# Patient Record
Sex: Male | Born: 1955
Health system: Southern US, Community
[De-identification: ages and names within clinical notes are randomized; demographics above are authoritative.]

## PROBLEM LIST (undated history)

## (undated) DIAGNOSIS — G4730 Sleep apnea, unspecified: Secondary | ICD-10-CM

## (undated) DIAGNOSIS — I2699 Other pulmonary embolism without acute cor pulmonale: Secondary | ICD-10-CM

## (undated) DIAGNOSIS — D689 Coagulation defect, unspecified: Secondary | ICD-10-CM

## (undated) DIAGNOSIS — I10 Essential (primary) hypertension: Secondary | ICD-10-CM

## (undated) DIAGNOSIS — E7800 Pure hypercholesterolemia, unspecified: Secondary | ICD-10-CM

## (undated) DIAGNOSIS — C189 Malignant neoplasm of colon, unspecified: Secondary | ICD-10-CM

## (undated) HISTORY — DX: Essential (primary) hypertension: I10

## (undated) HISTORY — DX: Sleep apnea, unspecified: G47.30

## (undated) HISTORY — DX: Malignant neoplasm of colon, unspecified: C18.9

## (undated) HISTORY — PX: OTHER SURGICAL HISTORY: SHX169

## (undated) HISTORY — DX: Coagulation defect, unspecified: D68.9

## (undated) HISTORY — DX: Other pulmonary embolism without acute cor pulmonale: I26.99

## (undated) HISTORY — DX: Pure hypercholesterolemia, unspecified: E78.00

---
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
## 2005-02-26 ENCOUNTER — Ambulatory Visit (HOSPITAL_COMMUNITY): Admission: RE | Admit: 2005-02-26 | Discharge: 2005-02-26 | Payer: Self-pay | Admitting: Gastroenterology

## 2005-02-26 ENCOUNTER — Encounter (INDEPENDENT_AMBULATORY_CARE_PROVIDER_SITE_OTHER): Payer: Self-pay | Admitting: Specialist

## 2005-02-27 ENCOUNTER — Ambulatory Visit (HOSPITAL_COMMUNITY): Admission: RE | Admit: 2005-02-27 | Discharge: 2005-02-27 | Payer: Self-pay | Admitting: Gastroenterology

## 2005-03-02 ENCOUNTER — Ambulatory Visit: Payer: Self-pay | Admitting: Hematology and Oncology

## 2005-03-12 ENCOUNTER — Ambulatory Visit (HOSPITAL_COMMUNITY): Admission: RE | Admit: 2005-03-12 | Discharge: 2005-03-12 | Payer: Self-pay | Admitting: Hematology and Oncology

## 2005-03-17 ENCOUNTER — Ambulatory Visit (HOSPITAL_COMMUNITY): Admission: RE | Admit: 2005-03-17 | Discharge: 2005-03-17 | Payer: Self-pay | Admitting: Hematology and Oncology

## 2005-03-19 ENCOUNTER — Ambulatory Visit: Admission: RE | Admit: 2005-03-19 | Discharge: 2005-05-18 | Payer: Self-pay | Admitting: Radiation Oncology

## 2005-04-21 ENCOUNTER — Ambulatory Visit: Payer: Self-pay | Admitting: Hematology and Oncology

## 2005-07-07 ENCOUNTER — Ambulatory Visit: Payer: Self-pay | Admitting: Hematology and Oncology

## 2005-08-26 ENCOUNTER — Ambulatory Visit: Payer: Self-pay | Admitting: Hematology & Oncology

## 2005-09-05 ENCOUNTER — Inpatient Hospital Stay (HOSPITAL_COMMUNITY): Admission: EM | Admit: 2005-09-05 | Discharge: 2005-09-09 | Payer: Self-pay | Admitting: Emergency Medicine

## 2005-09-06 ENCOUNTER — Ambulatory Visit: Payer: Self-pay | Admitting: Oncology

## 2005-10-12 HISTORY — PX: COLON SURGERY: SHX602

## 2005-10-13 ENCOUNTER — Ambulatory Visit: Payer: Self-pay | Admitting: Hematology & Oncology

## 2005-12-03 ENCOUNTER — Ambulatory Visit: Payer: Self-pay | Admitting: Hematology & Oncology

## 2006-01-13 ENCOUNTER — Ambulatory Visit (HOSPITAL_COMMUNITY): Admission: RE | Admit: 2006-01-13 | Discharge: 2006-01-13 | Payer: Self-pay | Admitting: Hematology & Oncology

## 2006-01-15 LAB — PROTIME-INR: INR: 3 (ref 2.00–3.50)

## 2006-01-18 ENCOUNTER — Ambulatory Visit: Payer: Self-pay | Admitting: Hematology & Oncology

## 2006-01-21 LAB — PROTIME-INR: Protime: 20.9 Seconds — ABNORMAL HIGH (ref 10.6–13.4)

## 2006-02-03 LAB — PROTIME-INR

## 2006-02-05 LAB — PROTIME-INR

## 2006-02-12 LAB — PROTIME-INR: Protime: 20.3 Seconds — ABNORMAL HIGH (ref 10.6–13.4)

## 2006-02-19 LAB — PROTIME-INR: Protime: 16.8 Seconds — ABNORMAL HIGH (ref 10.6–13.4)

## 2006-02-26 LAB — PROTIME-INR
INR: 1.6 — ABNORMAL LOW (ref 2.00–3.50)
Protime: 15.7 Seconds — ABNORMAL HIGH (ref 10.6–13.4)

## 2006-03-05 ENCOUNTER — Ambulatory Visit: Payer: Self-pay | Admitting: Hematology & Oncology

## 2006-03-05 LAB — PROTIME-INR: INR: 1.5 — ABNORMAL LOW (ref 2.00–3.50)

## 2006-03-12 LAB — PROTIME-INR
INR: 1.5 — ABNORMAL LOW (ref 2.00–3.50)
Protime: 15.3 Seconds — ABNORMAL HIGH (ref 10.6–13.4)

## 2006-03-19 LAB — PROTIME-INR: INR: 1.7 — ABNORMAL LOW (ref 2.00–3.50)

## 2006-03-26 LAB — PROTIME-INR: INR: 1.7 — ABNORMAL LOW (ref 2.00–3.50)

## 2006-04-07 LAB — CBC WITH DIFFERENTIAL/PLATELET
BASO%: 0.4 % (ref 0.0–2.0)
EOS%: 0.6 % (ref 0.0–7.0)
HCT: 39.3 % (ref 38.7–49.9)
HGB: 13.8 g/dL (ref 13.0–17.1)
MCH: 29.7 pg (ref 28.0–33.4)
MCHC: 35.1 g/dL (ref 32.0–35.9)
MONO#: 0.4 10*3/uL (ref 0.1–0.9)
NEUT%: 53.2 % (ref 40.0–75.0)
RDW: 12.5 % (ref 11.2–14.6)
WBC: 4.4 10*3/uL (ref 4.0–10.0)
lymph#: 1.6 10*3/uL (ref 0.9–3.3)

## 2006-04-07 LAB — COMPREHENSIVE METABOLIC PANEL
AST: 23 U/L (ref 0–37)
Albumin: 5 g/dL (ref 3.5–5.2)
Alkaline Phosphatase: 81 U/L (ref 39–117)
Calcium: 9.5 mg/dL (ref 8.4–10.5)
Creatinine, Ser: 0.9 mg/dL (ref 0.40–1.50)
Sodium: 139 mEq/L (ref 135–145)
Total Bilirubin: 0.6 mg/dL (ref 0.3–1.2)

## 2006-04-07 LAB — PROTIME-INR: INR: 1.9 — ABNORMAL LOW (ref 2.00–3.50)

## 2006-04-21 ENCOUNTER — Ambulatory Visit (HOSPITAL_COMMUNITY): Admission: RE | Admit: 2006-04-21 | Discharge: 2006-04-21 | Payer: Self-pay | Admitting: Hematology & Oncology

## 2006-04-22 ENCOUNTER — Ambulatory Visit: Payer: Self-pay | Admitting: Hematology & Oncology

## 2006-05-28 LAB — PROTIME-INR: Protime: 14.4 Seconds — ABNORMAL HIGH (ref 10.6–13.4)

## 2006-06-08 ENCOUNTER — Ambulatory Visit: Payer: Self-pay | Admitting: Hematology & Oncology

## 2006-06-09 LAB — PROTIME-INR: INR: 1.2 — ABNORMAL LOW (ref 2.00–3.50)

## 2006-06-30 LAB — COMPREHENSIVE METABOLIC PANEL
ALT: 20 U/L (ref 0–40)
Alkaline Phosphatase: 78 U/L (ref 39–117)
CO2: 29 mEq/L (ref 19–32)
Potassium: 3.9 mEq/L (ref 3.5–5.3)
Sodium: 141 mEq/L (ref 135–145)
Total Bilirubin: 0.5 mg/dL (ref 0.3–1.2)
Total Protein: 7.2 g/dL (ref 6.0–8.3)

## 2006-06-30 LAB — CBC WITH DIFFERENTIAL/PLATELET
BASO%: 0.3 % (ref 0.0–2.0)
Eosinophils Absolute: 0 10*3/uL (ref 0.0–0.5)
HCT: 36 % — ABNORMAL LOW (ref 38.7–49.9)
MCHC: 35.5 g/dL (ref 32.0–35.9)
MONO#: 0.4 10*3/uL (ref 0.1–0.9)
NEUT#: 3.2 10*3/uL (ref 1.5–6.5)
NEUT%: 64.6 % (ref 40.0–75.0)
WBC: 4.9 10*3/uL (ref 4.0–10.0)
lymph#: 1.3 10*3/uL (ref 0.9–3.3)

## 2006-06-30 LAB — PROTIME-INR: Protime: 19.2 Seconds — ABNORMAL HIGH (ref 10.6–13.4)

## 2006-07-21 ENCOUNTER — Ambulatory Visit: Payer: Self-pay | Admitting: Hematology & Oncology

## 2006-07-22 LAB — PROTIME-INR
INR: 2.1 (ref 2.00–3.50)
Protime: 25.2 Seconds — ABNORMAL HIGH (ref 10.6–13.4)

## 2006-08-04 ENCOUNTER — Inpatient Hospital Stay (HOSPITAL_COMMUNITY): Admission: EM | Admit: 2006-08-04 | Discharge: 2006-08-30 | Payer: Self-pay | Admitting: Emergency Medicine

## 2006-08-04 ENCOUNTER — Ambulatory Visit: Payer: Self-pay | Admitting: Pulmonary Disease

## 2006-08-13 ENCOUNTER — Ambulatory Visit: Payer: Self-pay | Admitting: Hematology & Oncology

## 2006-08-13 ENCOUNTER — Encounter (INDEPENDENT_AMBULATORY_CARE_PROVIDER_SITE_OTHER): Payer: Self-pay | Admitting: *Deleted

## 2006-08-16 ENCOUNTER — Encounter: Payer: Self-pay | Admitting: Internal Medicine

## 2006-08-16 ENCOUNTER — Ambulatory Visit: Payer: Self-pay | Admitting: Internal Medicine

## 2006-09-01 ENCOUNTER — Ambulatory Visit: Payer: Self-pay | Admitting: Hematology & Oncology

## 2006-09-01 LAB — COMPREHENSIVE METABOLIC PANEL
ALT: 24 U/L (ref 0–53)
Alkaline Phosphatase: 177 U/L — ABNORMAL HIGH (ref 39–117)
CO2: 28 mEq/L (ref 19–32)
Creatinine, Ser: 0.82 mg/dL (ref 0.40–1.50)
Total Bilirubin: 0.4 mg/dL (ref 0.3–1.2)

## 2006-09-01 LAB — PROTIME-INR: INR: 4.4 — ABNORMAL HIGH (ref 2.00–3.50)

## 2006-09-01 LAB — CEA: CEA: <0.5 ng/mL (ref 0.0–5.0)

## 2006-09-01 LAB — CBC WITH DIFFERENTIAL/PLATELET
Basophils Absolute: 0 10*3/uL (ref 0.0–0.1)
Eosinophils Absolute: 0.4 10*3/uL (ref 0.0–0.5)
HCT: 31.8 % — ABNORMAL LOW (ref 38.7–49.9)
HGB: 11 g/dL — ABNORMAL LOW (ref 13.0–17.1)
MCH: 30.1 pg (ref 28.0–33.4)
MONO#: 0.5 10*3/uL (ref 0.1–0.9)
NEUT#: 3.9 10*3/uL (ref 1.5–6.5)
NEUT%: 66.1 % (ref 40.0–75.0)
RDW: 13.7 % (ref 11.2–14.6)
WBC: 5.9 10*3/uL (ref 4.0–10.0)
lymph#: 1.1 10*3/uL (ref 0.9–3.3)

## 2006-09-06 LAB — PROTIME-INR

## 2006-09-13 LAB — PROTIME-INR
INR: 3.8 — ABNORMAL HIGH (ref 2.00–3.50)
Protime: 45.6 Seconds — ABNORMAL HIGH (ref 10.6–13.4)

## 2006-09-20 LAB — PROTIME-INR
INR: 2.4 (ref 2.00–3.50)
Protime: 28.8 Seconds — ABNORMAL HIGH (ref 10.6–13.4)

## 2006-09-24 LAB — COMPREHENSIVE METABOLIC PANEL
ALT: 13 U/L (ref 0–53)
AST: 14 U/L (ref 0–37)
Alkaline Phosphatase: 84 U/L (ref 39–117)
BUN: 13 mg/dL (ref 6–23)
Calcium: 9.6 mg/dL (ref 8.4–10.5)
Chloride: 102 mEq/L (ref 96–112)
Creatinine, Ser: 0.71 mg/dL (ref 0.40–1.50)

## 2006-09-24 LAB — CBC WITH DIFFERENTIAL/PLATELET
EOS%: 2.8 % (ref 0.0–7.0)
LYMPH%: 24.1 % (ref 14.0–48.0)
MCH: 29 pg (ref 28.0–33.4)
MCHC: 34.6 g/dL (ref 32.0–35.9)
MCV: 83.9 fL (ref 81.6–98.0)
MONO%: 6.6 % (ref 0.0–13.0)
Platelets: 236 10*3/uL (ref 145–400)
RBC: 4.36 10*6/uL (ref 4.20–5.71)
RDW: 11.7 % (ref 11.2–14.6)

## 2006-09-24 LAB — PROTIME-INR
INR: 2.6 (ref 2.00–3.50)
Protime: 31.2 Seconds — ABNORMAL HIGH (ref 10.6–13.4)

## 2006-10-08 LAB — PROTIME-INR: INR: 2.4 (ref 2.00–3.50)

## 2006-10-19 ENCOUNTER — Ambulatory Visit: Payer: Self-pay | Admitting: Hematology & Oncology

## 2006-10-22 LAB — PROTIME-INR: Protime: 30 Seconds — ABNORMAL HIGH (ref 10.6–13.4)

## 2006-11-05 LAB — CBC WITH DIFFERENTIAL/PLATELET
Basophils Absolute: 0 10*3/uL (ref 0.0–0.1)
Eosinophils Absolute: 0.1 10*3/uL (ref 0.0–0.5)
HCT: 41.5 % (ref 38.7–49.9)
LYMPH%: 29.8 % (ref 14.0–48.0)
MCV: 83.9 fL (ref 81.6–98.0)
MONO#: 0.3 10*3/uL (ref 0.1–0.9)
MONO%: 7.4 % (ref 0.0–13.0)
NEUT#: 2.8 10*3/uL (ref 1.5–6.5)
NEUT%: 60.7 % (ref 40.0–75.0)
Platelets: 270 10*3/uL (ref 145–400)
RBC: 4.95 10*6/uL (ref 4.20–5.71)
WBC: 4.6 10*3/uL (ref 4.0–10.0)

## 2006-11-05 LAB — COMPREHENSIVE METABOLIC PANEL
ALT: 12 U/L (ref 0–53)
AST: 16 U/L (ref 0–37)
BUN: 10 mg/dL (ref 6–23)
Calcium: 9.8 mg/dL (ref 8.4–10.5)
Chloride: 102 mEq/L (ref 96–112)
Creatinine, Ser: 0.92 mg/dL (ref 0.40–1.50)
Total Bilirubin: 0.4 mg/dL (ref 0.3–1.2)

## 2006-11-05 LAB — CEA: CEA: 0.5 ng/mL (ref 0.0–5.0)

## 2006-11-19 LAB — PROTIME-INR: INR: 2.5 (ref 2.00–3.50)

## 2006-12-01 ENCOUNTER — Ambulatory Visit: Payer: Self-pay | Admitting: Hematology & Oncology

## 2006-12-03 ENCOUNTER — Ambulatory Visit (HOSPITAL_COMMUNITY): Admission: RE | Admit: 2006-12-03 | Discharge: 2006-12-03 | Payer: Self-pay | Admitting: Hematology & Oncology

## 2006-12-03 LAB — PROTIME-INR: Protime: 33.6 Seconds — ABNORMAL HIGH (ref 10.6–13.4)

## 2007-01-12 ENCOUNTER — Ambulatory Visit: Payer: Self-pay | Admitting: Hematology & Oncology

## 2007-01-14 LAB — PROTIME-INR: Protime: 22.8 Seconds — ABNORMAL HIGH (ref 10.6–13.4)

## 2007-02-14 LAB — PROTIME-INR: Protime: 22.8 Seconds — ABNORMAL HIGH (ref 10.6–13.4)

## 2007-03-20 ENCOUNTER — Ambulatory Visit: Payer: Self-pay | Admitting: Hematology & Oncology

## 2007-03-21 ENCOUNTER — Ambulatory Visit (HOSPITAL_COMMUNITY): Admission: RE | Admit: 2007-03-21 | Discharge: 2007-03-21 | Payer: Self-pay | Admitting: Hematology & Oncology

## 2007-03-23 LAB — CBC WITH DIFFERENTIAL/PLATELET
Eosinophils Absolute: 0.1 10*3/uL (ref 0.0–0.5)
HCT: 38.5 % — ABNORMAL LOW (ref 38.7–49.9)
LYMPH%: 40.9 % (ref 14.0–48.0)
MCHC: 35.9 g/dL (ref 32.0–35.9)
MCV: 83.4 fL (ref 81.6–98.0)
MONO#: 0.3 10*3/uL (ref 0.1–0.9)
MONO%: 7.8 % (ref 0.0–13.0)
NEUT#: 2 10*3/uL (ref 1.5–6.5)
NEUT%: 49.4 % (ref 40.0–75.0)
Platelets: 241 10*3/uL (ref 145–400)
WBC: 4.1 10*3/uL (ref 4.0–10.0)

## 2007-03-23 LAB — COMPREHENSIVE METABOLIC PANEL
Albumin: 4.6 g/dL (ref 3.5–5.2)
BUN: 15 mg/dL (ref 6–23)
CO2: 24 mEq/L (ref 19–32)
Calcium: 9.3 mg/dL (ref 8.4–10.5)
Glucose, Bld: 87 mg/dL (ref 70–99)
Potassium: 4.3 mEq/L (ref 3.5–5.3)
Sodium: 139 mEq/L (ref 135–145)
Total Protein: 7.3 g/dL (ref 6.0–8.3)

## 2007-03-23 LAB — CEA: CEA: <0.5 ng/mL (ref 0.0–5.0)

## 2007-06-01 ENCOUNTER — Ambulatory Visit: Payer: Self-pay | Admitting: Hematology & Oncology

## 2007-06-03 LAB — PROTIME-INR: Protime: 15.6 Seconds — ABNORMAL HIGH (ref 10.6–13.4)

## 2007-07-04 ENCOUNTER — Ambulatory Visit (HOSPITAL_COMMUNITY): Admission: RE | Admit: 2007-07-04 | Discharge: 2007-07-04 | Payer: Self-pay | Admitting: Hematology & Oncology

## 2007-07-04 LAB — PROTIME-INR
INR: 1.9 — ABNORMAL LOW (ref 2.00–3.50)
Protime: 22.8 Seconds — ABNORMAL HIGH (ref 10.6–13.4)

## 2007-07-21 ENCOUNTER — Ambulatory Visit: Payer: Self-pay | Admitting: Hematology & Oncology

## 2007-07-25 LAB — CBC WITH DIFFERENTIAL/PLATELET
BASO%: 0.6 % (ref 0.0–2.0)
EOS%: 1.6 % (ref 0.0–7.0)
MCH: 30.6 pg (ref 28.0–33.4)
MCHC: 36.5 g/dL — ABNORMAL HIGH (ref 32.0–35.9)
MCV: 84 fL (ref 81.6–98.0)
MONO%: 8.3 % (ref 0.0–13.0)
NEUT#: 2.4 10*3/uL (ref 1.5–6.5)
RBC: 4.74 10*6/uL (ref 4.20–5.71)
RDW: 13.2 % (ref 11.2–14.6)

## 2007-07-25 LAB — CEA: CEA: <0.5 ng/mL (ref 0.0–5.0)

## 2007-07-25 LAB — COMPREHENSIVE METABOLIC PANEL
BUN: 9 mg/dL (ref 6–23)
CO2: 26 mEq/L (ref 19–32)
Creatinine, Ser: 0.83 mg/dL (ref 0.40–1.50)
Glucose, Bld: 97 mg/dL (ref 70–99)
Total Bilirubin: 0.8 mg/dL (ref 0.3–1.2)
Total Protein: 7.4 g/dL (ref 6.0–8.3)

## 2007-07-25 LAB — PROTIME-INR
INR: 2.4 (ref 2.00–3.50)
Protime: 28.8 Seconds — ABNORMAL HIGH (ref 10.6–13.4)

## 2007-09-05 ENCOUNTER — Ambulatory Visit: Payer: Self-pay | Admitting: Hematology & Oncology

## 2007-09-05 LAB — PROTIME-INR
INR: 2.1 (ref 2.00–3.50)
Protime: 25.2 Seconds — ABNORMAL HIGH (ref 10.6–13.4)

## 2007-10-12 ENCOUNTER — Ambulatory Visit: Payer: Self-pay | Admitting: Hematology & Oncology

## 2007-10-17 ENCOUNTER — Ambulatory Visit (HOSPITAL_COMMUNITY): Admission: RE | Admit: 2007-10-17 | Discharge: 2007-10-17 | Payer: Self-pay | Admitting: Hematology & Oncology

## 2007-10-17 LAB — PROTIME-INR: Protime: 26.4 Seconds — ABNORMAL HIGH (ref 10.6–13.4)

## 2007-11-30 ENCOUNTER — Ambulatory Visit: Payer: Self-pay | Admitting: Hematology & Oncology

## 2007-12-05 LAB — CBC WITH DIFFERENTIAL/PLATELET
BASO%: 1.1 % (ref 0.0–2.0)
Eosinophils Absolute: 0 10*3/uL (ref 0.0–0.5)
LYMPH%: 33.5 % (ref 14.0–48.0)
MCH: 30.5 pg (ref 28.0–33.4)
MCHC: 35.8 g/dL (ref 32.0–35.9)
MCV: 85 fL (ref 81.6–98.0)
MONO%: 5.9 % (ref 0.0–13.0)
Platelets: 243 10*3/uL (ref 145–400)
RBC: 4.65 10*6/uL (ref 4.20–5.71)

## 2007-12-05 LAB — COMPREHENSIVE METABOLIC PANEL
Alkaline Phosphatase: 65 U/L (ref 39–117)
Glucose, Bld: 94 mg/dL (ref 70–99)
Sodium: 138 mEq/L (ref 135–145)
Total Bilirubin: 0.5 mg/dL (ref 0.3–1.2)
Total Protein: 7.5 g/dL (ref 6.0–8.3)

## 2007-12-05 LAB — PROTHROMBIN TIME: INR: 1.8 — ABNORMAL HIGH (ref 0.0–1.5)

## 2008-01-09 ENCOUNTER — Ambulatory Visit: Payer: Self-pay | Admitting: Hematology & Oncology

## 2008-01-11 IMAGING — CT CT ABDOMEN W/ CM
2 of 5 series · 17 of 46 positions shown, 19 images · IV contrast (omnipaque)
Comparison: 04/21/2006

CHEST CT WITH CONTRAST

CLINICAL DATA: Abdominal pain, nausea. History of rectal cancer and colon
resection.
TECHNIQUE: Multidetector CT imaging of the chest, abdomen, and pelvis was
performed following the standard protocol during bolus administration of
intravenous contrast.

Contrast:  125 cc Omnipaque 300

[Series 2: cap 5.0 b40f st · axial · 0.68mm/px · z∈[+808,+1358]mm · 14 of 124 slices shown, 16 images]
[im 7/124  soft-tissue]
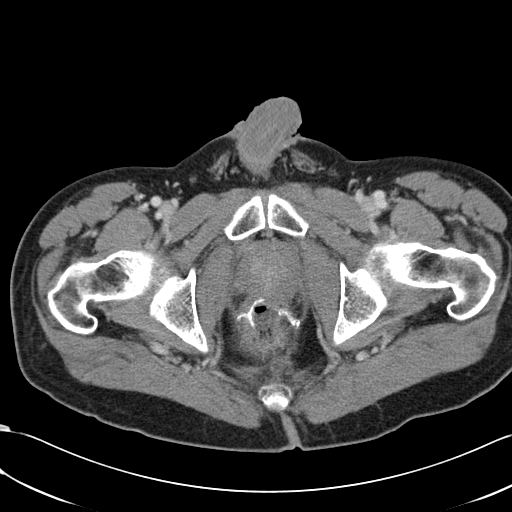
[im 7/124  bone]
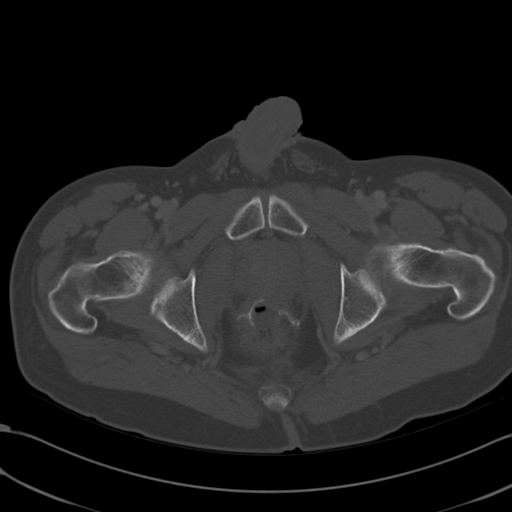
[im 14/124  soft-tissue]
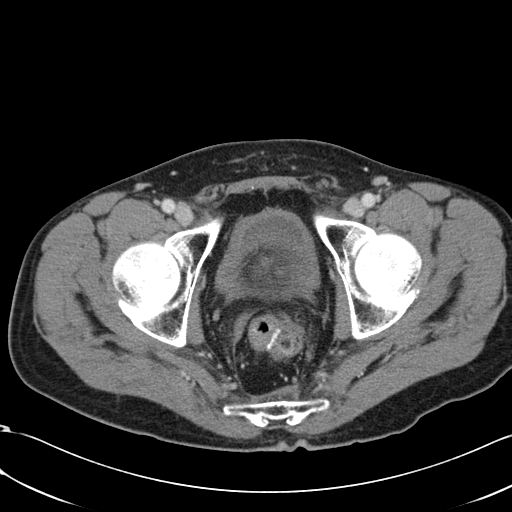
[im 28/124  soft-tissue]
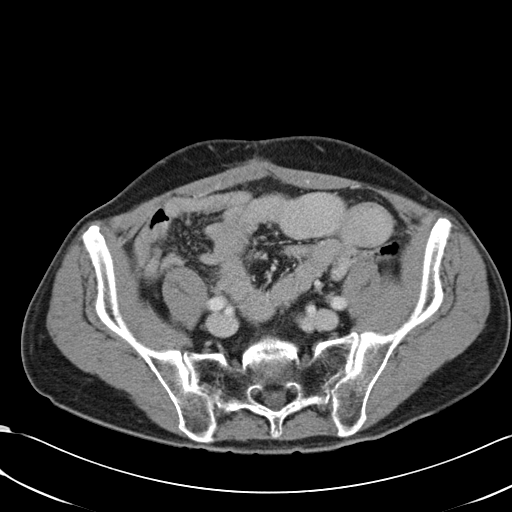
[im 35/124  soft-tissue]
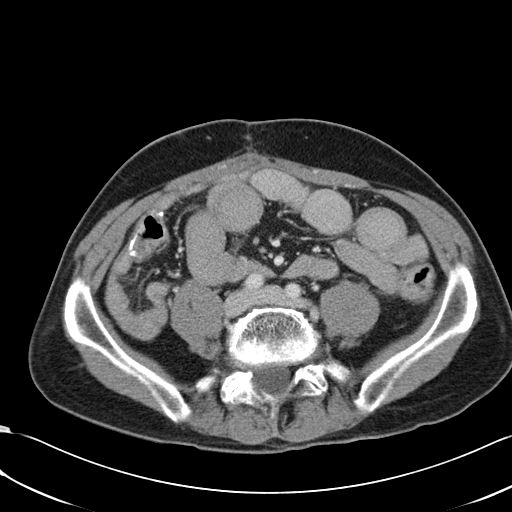
[im 42/124  soft-tissue]
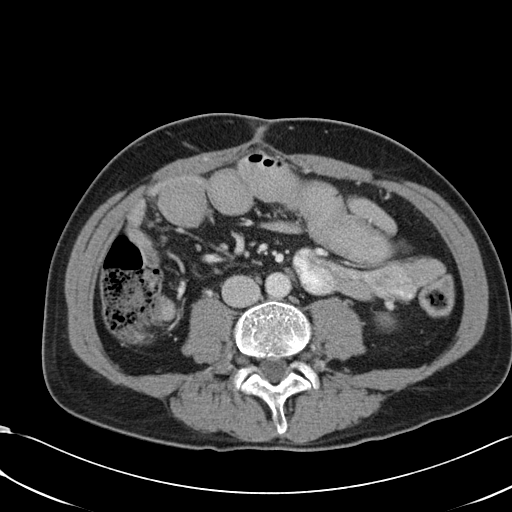
[im 48/124  soft-tissue]
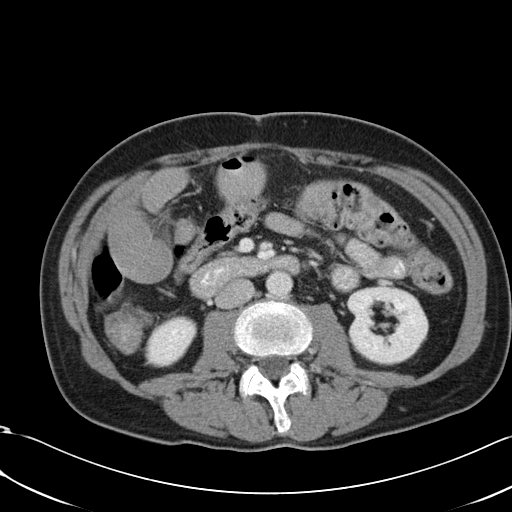
[im 55/124  soft-tissue]
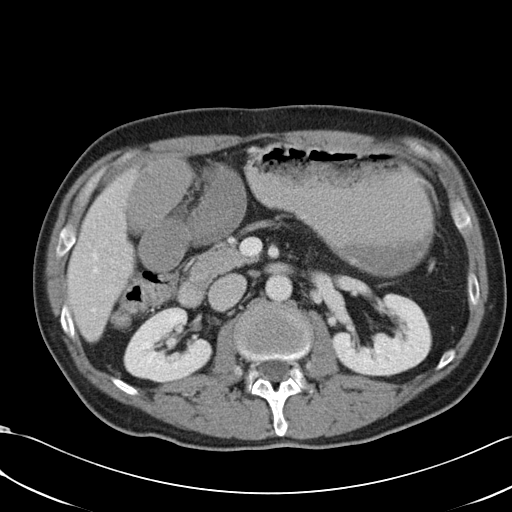
[im 69/124  soft-tissue]
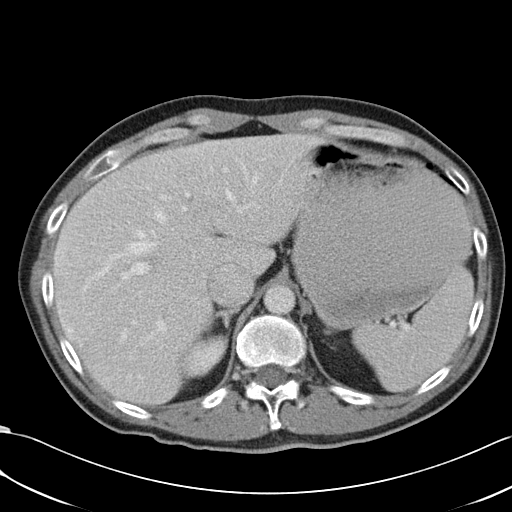
[im 76/124  soft-tissue]
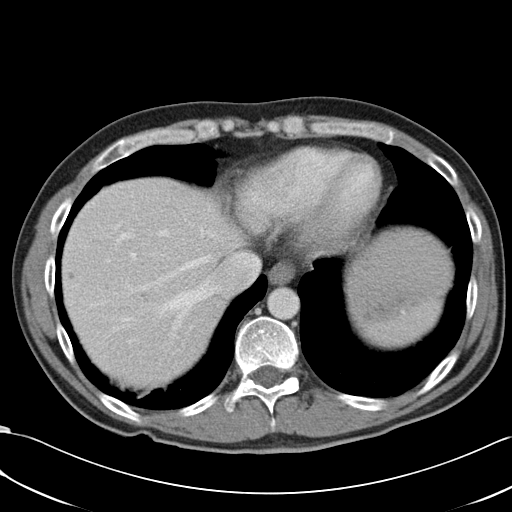
[im 76/124  bone]
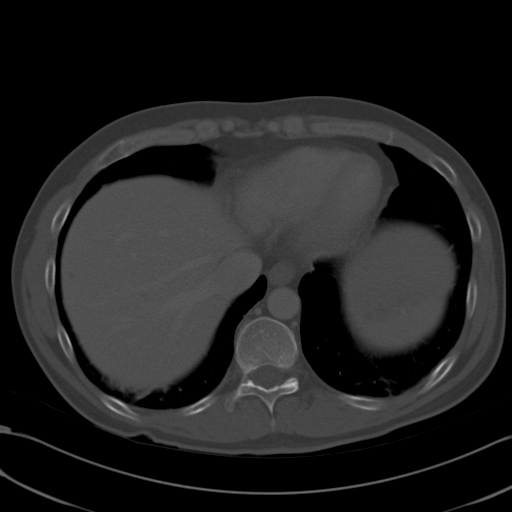
[im 83/124  soft-tissue]
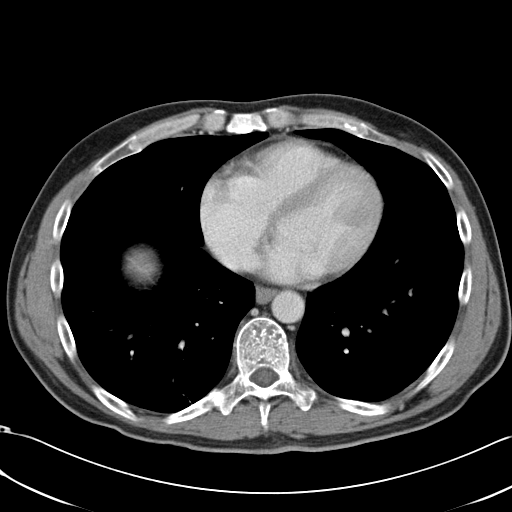
[im 89/124  soft-tissue]
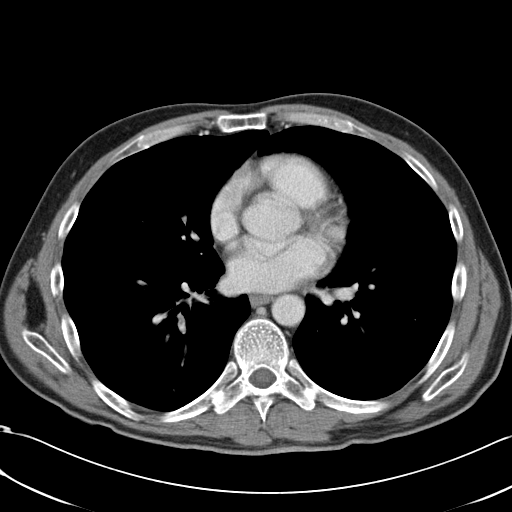
[im 96/124  soft-tissue]
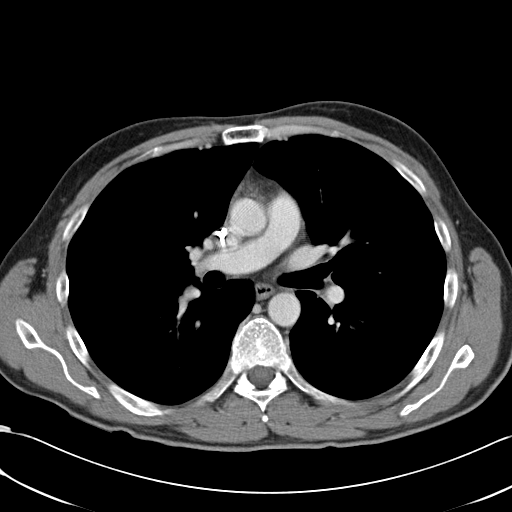
[im 110/124  soft-tissue]
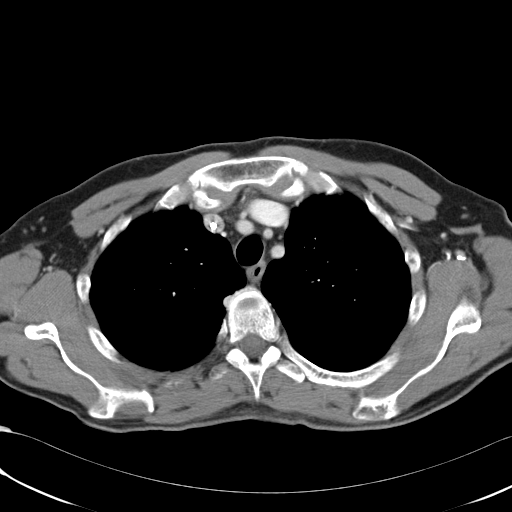
[im 117/124  soft-tissue]
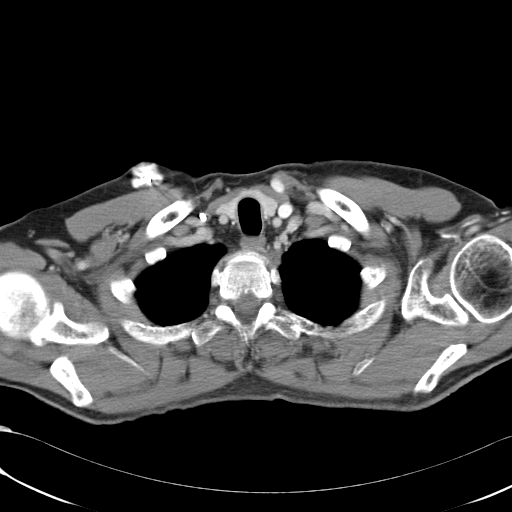

[Series 602: <mpr thick range> · coronal · 1.24mm/px · 3 of 71 slices shown]
[im 24/71  soft-tissue]
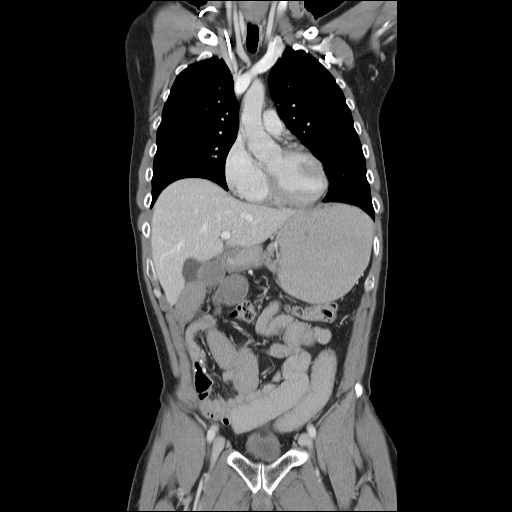
[im 32/71  soft-tissue]
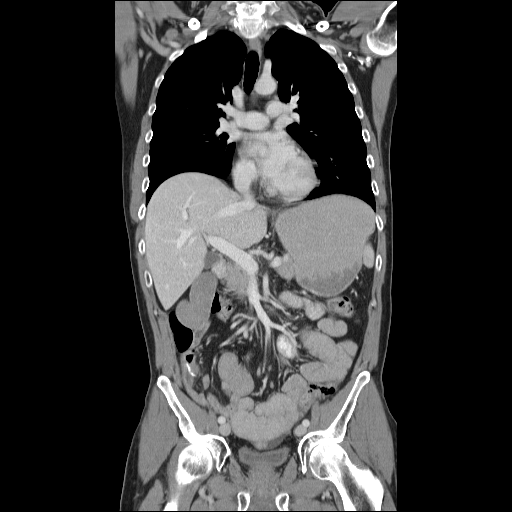
[im 39/71  soft-tissue]
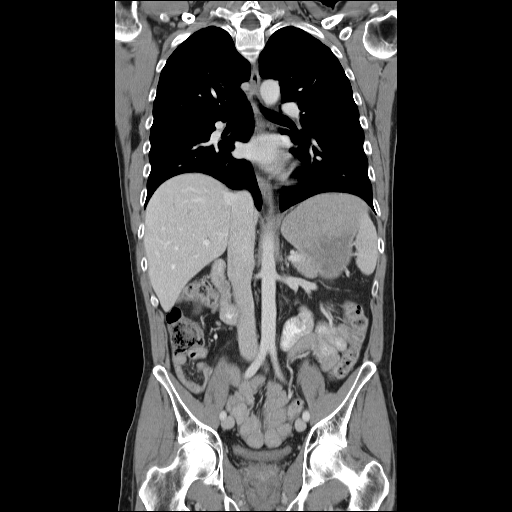

[17 of 46 positions shown; findings below may reference images not displayed]

FINDINGS: No mediastinal, hilar, or axillary adenopathy. Aorta and great
vessels normal. No pleural or pericardial effusion. There is minimal bibasilar
atelectasis. Otherwise the lungs are clear. No focal bony abnormality.

IMPRESSION

Bibasilar atelectasis. No evidence of metastatic disease.

ABDOMEN CT WITH CONTRAST
FINDINGS: Again noted are multiple tiny low density lesion in the liver, too
small to characterize, but unchanged since prior study. Spleen, pancreas,
adrenals, kidneys unremarkable.

There are dilated small bowel loops within the abdomen concerning for small
bowel obstruction. This continues into pelvis. No free fluid, free air, or
adenopathy. No focal bony abnormality.

IMPRESSION

Stable tiny low density lesions within the liver, too small to characterize.

Small bowel obstruction.

PELVIS CT WITH CONTRAST
FINDINGS: Dilated small bowel loops continue into the pelvis. Distal small
bowel loops are decompressed. The exact transition point or etiology of the
small bowel obstruction cannot be identified. Postoperative changes are noted in
the right lower quadrant and rectosigmoid region. No free fluid, free air, or
adenopathy. No focal bony abnormality.

IMPRESSION

Distal small bowel obstruction. Exact etiology not identified.

## 2008-01-12 ENCOUNTER — Ambulatory Visit (HOSPITAL_COMMUNITY): Admission: RE | Admit: 2008-01-12 | Discharge: 2008-01-12 | Payer: Self-pay | Admitting: Hematology & Oncology

## 2008-02-06 LAB — PROTIME-INR: Protime: 19.2 Seconds — ABNORMAL HIGH (ref 10.6–13.4)

## 2008-02-29 ENCOUNTER — Ambulatory Visit: Payer: Self-pay | Admitting: Hematology & Oncology

## 2008-04-17 ENCOUNTER — Ambulatory Visit: Payer: Self-pay | Admitting: Hematology & Oncology

## 2008-04-20 LAB — PROTIME-INR
INR: 2.3 (ref 2.00–3.50)
Protime: 27.6 Seconds — ABNORMAL HIGH (ref 10.6–13.4)

## 2008-04-23 ENCOUNTER — Ambulatory Visit (HOSPITAL_COMMUNITY): Admission: RE | Admit: 2008-04-23 | Discharge: 2008-04-23 | Payer: Self-pay | Admitting: Hematology & Oncology

## 2008-05-30 ENCOUNTER — Ambulatory Visit: Payer: Self-pay | Admitting: Hematology & Oncology

## 2008-06-01 LAB — CBC WITH DIFFERENTIAL (CANCER CENTER ONLY)
BASO#: 0 10*3/uL (ref 0.0–0.2)
HCT: 40.3 % (ref 38.7–49.9)
HGB: 14.3 g/dL (ref 13.0–17.1)
LYMPH#: 1.6 10*3/uL (ref 0.9–3.3)
MONO#: 0.2 10*3/uL (ref 0.1–0.9)
NEUT%: 56.2 % (ref 40.0–80.0)
WBC: 4.3 10*3/uL (ref 4.0–10.0)

## 2008-06-01 LAB — COMPREHENSIVE METABOLIC PANEL
Albumin: 4.6 g/dL (ref 3.5–5.2)
BUN: 14 mg/dL (ref 6–23)
CO2: 23 mEq/L (ref 19–32)
Glucose, Bld: 125 mg/dL — ABNORMAL HIGH (ref 70–99)
Potassium: 3.9 mEq/L (ref 3.5–5.3)
Sodium: 139 mEq/L (ref 135–145)
Total Protein: 7.5 g/dL (ref 6.0–8.3)

## 2008-06-01 LAB — CEA: CEA: 0.7 ng/mL (ref 0.0–5.0)

## 2008-07-16 ENCOUNTER — Ambulatory Visit (HOSPITAL_COMMUNITY): Admission: RE | Admit: 2008-07-16 | Discharge: 2008-07-16 | Payer: Self-pay | Admitting: Neurosurgery

## 2008-07-26 ENCOUNTER — Ambulatory Visit: Payer: Self-pay | Admitting: Hematology & Oncology

## 2008-07-27 LAB — PROTIME-INR (CHCC SATELLITE): Protime: 42 Seconds — ABNORMAL HIGH (ref 10.6–13.4)

## 2008-09-27 ENCOUNTER — Ambulatory Visit: Payer: Self-pay | Admitting: Hematology & Oncology

## 2008-10-02 ENCOUNTER — Inpatient Hospital Stay (HOSPITAL_COMMUNITY): Admission: RE | Admit: 2008-10-02 | Discharge: 2008-10-04 | Payer: Self-pay | Admitting: Neurosurgery

## 2008-11-15 ENCOUNTER — Ambulatory Visit: Payer: Self-pay | Admitting: Hematology & Oncology

## 2008-11-16 ENCOUNTER — Ambulatory Visit (HOSPITAL_COMMUNITY): Admission: RE | Admit: 2008-11-16 | Discharge: 2008-11-16 | Payer: Self-pay | Admitting: Hematology & Oncology

## 2008-11-16 LAB — CBC WITH DIFFERENTIAL (CANCER CENTER ONLY)
BASO%: 0.5 % (ref 0.0–2.0)
Eosinophils Absolute: 0.1 10*3/uL (ref 0.0–0.5)
HCT: 39.9 % (ref 38.7–49.9)
LYMPH#: 1.7 10*3/uL (ref 0.9–3.3)
MONO#: 0.2 10*3/uL (ref 0.1–0.9)
NEUT%: 61.5 % (ref 40.0–80.0)
Platelets: 297 10*3/uL (ref 145–400)
RBC: 4.6 10*6/uL (ref 4.20–5.70)
RDW: 11.6 % (ref 10.5–14.6)
WBC: 5.1 10*3/uL (ref 4.0–10.0)

## 2008-11-16 LAB — PROTIME-INR (CHCC SATELLITE)

## 2008-11-16 LAB — COMPREHENSIVE METABOLIC PANEL
ALT: 19 U/L (ref 0–53)
AST: 19 U/L (ref 0–37)
Albumin: 4.4 g/dL (ref 3.5–5.2)
BUN: 8 mg/dL (ref 6–23)
CO2: 26 mEq/L (ref 19–32)
Calcium: 9.3 mg/dL (ref 8.4–10.5)
Chloride: 99 mEq/L (ref 96–112)
Potassium: 3.7 mEq/L (ref 3.5–5.3)

## 2009-01-03 ENCOUNTER — Ambulatory Visit: Payer: Self-pay | Admitting: Hematology & Oncology

## 2009-01-04 ENCOUNTER — Ambulatory Visit (HOSPITAL_BASED_OUTPATIENT_CLINIC_OR_DEPARTMENT_OTHER): Admission: RE | Admit: 2009-01-04 | Discharge: 2009-01-04 | Payer: Self-pay | Admitting: Hematology & Oncology

## 2009-01-04 ENCOUNTER — Ambulatory Visit: Payer: Self-pay | Admitting: Diagnostic Radiology

## 2009-01-04 LAB — PROTIME-INR (CHCC SATELLITE)

## 2009-04-10 ENCOUNTER — Ambulatory Visit: Payer: Self-pay | Admitting: Hematology & Oncology

## 2009-04-12 ENCOUNTER — Ambulatory Visit (HOSPITAL_BASED_OUTPATIENT_CLINIC_OR_DEPARTMENT_OTHER): Admission: RE | Admit: 2009-04-12 | Discharge: 2009-04-12 | Payer: Self-pay | Admitting: Hematology & Oncology

## 2009-04-12 ENCOUNTER — Ambulatory Visit: Payer: Self-pay | Admitting: Diagnostic Radiology

## 2009-04-12 LAB — CBC WITH DIFFERENTIAL (CANCER CENTER ONLY)
BASO%: 0.9 % (ref 0.0–2.0)
LYMPH%: 34.1 % (ref 14.0–48.0)
MCH: 29.7 pg (ref 28.0–33.4)
MCV: 89 fL (ref 82–98)
MONO%: 6.5 % (ref 0.0–13.0)
Platelets: 254 10*3/uL (ref 145–400)
RDW: 11.7 % (ref 10.5–14.6)
WBC: 4.8 10*3/uL (ref 4.0–10.0)

## 2009-04-12 LAB — CMP (CANCER CENTER ONLY)
ALT(SGPT): 22 U/L (ref 10–47)
AST: 28 U/L (ref 11–38)
Albumin: 3.8 g/dL (ref 3.3–5.5)
Alkaline Phosphatase: 71 U/L (ref 26–84)
BUN, Bld: 12 mg/dL (ref 7–22)
Calcium: 9.7 mg/dL (ref 8.0–10.3)
Chloride: 97 mEq/L — ABNORMAL LOW (ref 98–108)
Potassium: 4.5 mEq/L (ref 3.3–4.7)

## 2009-04-12 LAB — PROTIME-INR (CHCC SATELLITE): INR: 1 — ABNORMAL LOW (ref 2.0–3.5)

## 2009-04-12 LAB — CEA: CEA: <0.5 ng/mL (ref 0.0–5.0)

## 2009-09-26 ENCOUNTER — Ambulatory Visit: Payer: Self-pay | Admitting: Hematology & Oncology

## 2009-09-27 ENCOUNTER — Ambulatory Visit (HOSPITAL_BASED_OUTPATIENT_CLINIC_OR_DEPARTMENT_OTHER): Admission: RE | Admit: 2009-09-27 | Discharge: 2009-09-27 | Payer: Self-pay | Admitting: Hematology & Oncology

## 2009-09-27 LAB — CBC WITH DIFFERENTIAL (CANCER CENTER ONLY)
Eosinophils Absolute: 0.1 10*3/uL (ref 0.0–0.5)
LYMPH%: 38.6 % (ref 14.0–48.0)
MCH: 30.9 pg (ref 28.0–33.4)
MCHC: 35.6 g/dL (ref 32.0–35.9)
MCV: 87 fL (ref 82–98)
MONO%: 5.1 % (ref 0.0–13.0)
Platelets: 235 10*3/uL (ref 145–400)
RBC: 4.43 10*6/uL (ref 4.20–5.70)

## 2009-09-27 LAB — CMP (CANCER CENTER ONLY)
CO2: 29 mEq/L (ref 18–33)
Creat: 0.8 mg/dl (ref 0.6–1.2)
Glucose, Bld: 89 mg/dL (ref 73–118)
Total Bilirubin: 0.9 mg/dl (ref 0.20–1.60)

## 2009-10-02 ENCOUNTER — Ambulatory Visit: Payer: Self-pay | Admitting: Diagnostic Radiology

## 2010-03-18 ENCOUNTER — Ambulatory Visit: Payer: Self-pay | Admitting: Hematology & Oncology

## 2010-03-21 ENCOUNTER — Ambulatory Visit (HOSPITAL_BASED_OUTPATIENT_CLINIC_OR_DEPARTMENT_OTHER): Admission: RE | Admit: 2010-03-21 | Discharge: 2010-03-21 | Payer: Self-pay | Admitting: Hematology & Oncology

## 2010-03-21 ENCOUNTER — Ambulatory Visit: Payer: Self-pay | Admitting: Diagnostic Radiology

## 2010-03-21 LAB — CBC WITH DIFFERENTIAL (CANCER CENTER ONLY)
BASO#: 0 10*3/uL (ref 0.0–0.2)
Eosinophils Absolute: 0.1 10*3/uL (ref 0.0–0.5)
HCT: 43.2 % (ref 38.7–49.9)
HGB: 14.9 g/dL (ref 13.0–17.1)
LYMPH#: 1.8 10*3/uL (ref 0.9–3.3)
MCH: 30.8 pg (ref 28.0–33.4)
MCHC: 34.6 g/dL (ref 32.0–35.9)
MONO%: 4.9 % (ref 0.0–13.0)
NEUT#: 3.1 10*3/uL (ref 1.5–6.5)
NEUT%: 58.6 % (ref 40.0–80.0)
RBC: 4.86 10*6/uL (ref 4.20–5.70)

## 2010-03-21 LAB — CMP (CANCER CENTER ONLY)
Albumin: 4.1 g/dL (ref 3.3–5.5)
BUN, Bld: 13 mg/dL (ref 7–22)
CO2: 31 mEq/L (ref 18–33)
Glucose, Bld: 97 mg/dL (ref 73–118)
Potassium: 4.3 mEq/L (ref 3.3–4.7)
Sodium: 141 mEq/L (ref 128–145)
Total Protein: 7.9 g/dL (ref 6.4–8.1)

## 2010-09-25 ENCOUNTER — Ambulatory Visit: Payer: Self-pay | Admitting: Hematology & Oncology

## 2010-09-26 LAB — CBC WITH DIFFERENTIAL (CANCER CENTER ONLY)
BASO#: 0 10*3/uL (ref 0.0–0.2)
Eosinophils Absolute: 0.1 10*3/uL (ref 0.0–0.5)
HGB: 14 g/dL (ref 13.0–17.1)
LYMPH#: 2 10*3/uL (ref 0.9–3.3)
MONO#: 0.3 10*3/uL (ref 0.1–0.9)
NEUT#: 2.4 10*3/uL (ref 1.5–6.5)
Platelets: 242 10*3/uL (ref 145–400)
RBC: 4.68 10*6/uL (ref 4.20–5.70)
WBC: 4.9 10*3/uL (ref 4.0–10.0)

## 2010-09-26 LAB — CEA: CEA: <0.5 ng/mL (ref 0.0–5.0)

## 2010-09-26 LAB — COMPREHENSIVE METABOLIC PANEL
Albumin: 4.6 g/dL (ref 3.5–5.2)
Alkaline Phosphatase: 51 U/L (ref 39–117)
CO2: 26 mEq/L (ref 19–32)
Calcium: 9.8 mg/dL (ref 8.4–10.5)
Chloride: 104 mEq/L (ref 96–112)
Glucose, Bld: 96 mg/dL (ref 70–99)
Potassium: 4.4 mEq/L (ref 3.5–5.3)
Sodium: 140 mEq/L (ref 135–145)
Total Protein: 7.1 g/dL (ref 6.0–8.3)

## 2010-11-01 ENCOUNTER — Encounter: Payer: Self-pay | Admitting: Hematology & Oncology

## 2011-02-16 ENCOUNTER — Other Ambulatory Visit: Payer: Self-pay | Admitting: Hematology & Oncology

## 2011-02-16 ENCOUNTER — Encounter (HOSPITAL_BASED_OUTPATIENT_CLINIC_OR_DEPARTMENT_OTHER): Payer: BC Managed Care – PPO | Admitting: Hematology & Oncology

## 2011-02-16 DIAGNOSIS — C20 Malignant neoplasm of rectum: Secondary | ICD-10-CM

## 2011-02-16 DIAGNOSIS — Z86718 Personal history of other venous thrombosis and embolism: Secondary | ICD-10-CM

## 2011-02-16 LAB — CBC WITH DIFFERENTIAL (CANCER CENTER ONLY)
Eosinophils Absolute: 0.1 10*3/uL (ref 0.0–0.5)
HCT: 37.8 % — ABNORMAL LOW (ref 38.7–49.9)
LYMPH%: 32.3 % (ref 14.0–48.0)
MCV: 84 fL (ref 82–98)
MONO#: 0.5 10*3/uL (ref 0.1–0.9)
NEUT%: 58.9 % (ref 40.0–80.0)
RBC: 4.51 10*6/uL (ref 4.20–5.70)
RDW: 12.6 % (ref 11.1–15.7)
WBC: 6.4 10*3/uL (ref 4.0–10.0)

## 2011-02-16 LAB — COMPREHENSIVE METABOLIC PANEL
AST: 26 U/L (ref 0–37)
Alkaline Phosphatase: 51 U/L (ref 39–117)
Glucose, Bld: 94 mg/dL (ref 70–99)
Sodium: 141 mEq/L (ref 135–145)
Total Bilirubin: 0.5 mg/dL (ref 0.3–1.2)
Total Protein: 7 g/dL (ref 6.0–8.3)

## 2011-02-24 NOTE — Op Note (Signed)
NAMEJUDITH, CAMPILLO                 ACCOUNT NO.:  [id number]   MEDICAL RECORD NO.:  [id number]          PATIENT TYPE:  INP   LOCATION:  3035                         FACILITY:  [facility]   PHYSICIAN:  Payton Doughty, M.D.      DATE OF BIRTH:  1956/02/05   DATE OF PROCEDURE:  10/02/2008  DATE OF DISCHARGE:                               OPERATIVE REPORT   PREOPERATIVE DIAGNOSIS:  Right S1 radiculopathy secondary to severe  spondylosis at 5-1 on the right.   POSTOPERATIVE DIAGNOSIS:  Right S1 radiculopathy secondary to severe  spondylosis at 5-1 on the right.   OPERATIVE PROCEDURE:  Right L5-S1 laminectomy and diskectomy, posterior  lumbar interbody fusion with Ray threaded fusion cages, posterolateral  arthrodesis, and left-sided laminar arthrodesis.   SURGEON:  Payton Doughty, MD, Neurosurgery.   ANESTHESIA:  General endotracheal.   PREPARATION:  Prepped and draped with alcohol wipe.   COMPLICATIONS:  None.   ASSISTANT:  None.   DOCTOR'S ASSISTANTS:  1. Bedelia Person, MD  2. Danae Orleans. Venetia Maxon, MD.   BODY OF TEXT:  This is a 55 year old with severe lumbar spondylosis  after diskectomy 10 years ago at 5-1 on the right.   Taken to the operating room, smoothly anesthetized and intubated, placed  prone on the operating table.  Following shave, prep, and drape in the  usual sterile fashion, skin was infiltrated with 1% lidocaine with  1:400,000 epinephrine.  The skin was incised from mid L4 through S1.  The lamina of L5 was exposed bilaterally in subperiosteal plane.  Intraoperative x-ray obtained to confirm correctness of level.  Having  confirmed correctness of level, the pars interarticularis, lamina, and  inferior facet of L5 on the right and the superior facet of S1 on the  right removed using a high-speed drill.  This facilitated decompression  of the right L5 and S1 nerve roots.  The S1 nerve root was encased in  scar and there was a ruptured disk underneath it.  This was also  dissected free and carefully removed.  A single Ray threaded fusion cage  was then placed on the right-sided manner in somewhat transverse fashion  and so reached the midline.  It was 12 x 21 mm and resulted immediate  distraction of the 5-1 disk space.  It was packed with bone graft,  harvested from the facet joints.  The lamina on the left side of L5-S1  decorticated with a  high-speed drill and packed with BMP on the extender matrix.  Intraoperative x-ray showed good placement of cage.  Successive layers  of 0 Vicryl, 2-0 Vicryl, and 3-0 nylon were used to close.  Betadine and  Telfa dressing was applied and made occlusive with OpSite.  The patient  returned to the recovery room in good condition.           ______________________________  Payton Doughty, M.D.     MWR/MEDQ  D:  10/02/2008  T:  10/02/2008  Job:  [id number]

## 2011-02-24 NOTE — H&P (Signed)
NAMEBAYARD, MORE                 ACCOUNT NO.:  [id number]   MEDICAL RECORD NO.:  [id number]          PATIENT TYPE:  INP   LOCATION:  2899                         FACILITY:  [facility]   PHYSICIAN:  Payton Doughty, M.D.      DATE OF BIRTH:  Mar 03, 1956   DATE OF ADMISSION:  10/02/2008  DATE OF DISCHARGE:                              HISTORY & PHYSICAL   ADMITTING DIAGNOSIS:  Lumbar spondylosis and right S1 radiculopathy.   BODY OF TEXT:  This is a very nice 55 year old right-handed white  gentleman who has been experiencing numbness in right foot since  September.  He has had it in the past, but it always go away.  It feels  like it is underneath his foot bottom to the day, it does not happen in  the left leg.  MR was obtained that shows significant encumbrance of the  right L5-S1 root at the site of his old diskectomy.  He is now admitted  for decompression and fusion.   MEDICAL HISTORY:  Remarkable for colon cancer resection in 2007,  takedown of a colostomy in 2007, and obstruction operation by Dr.  Zachery Dakins in 2008.  He has been on chemotherapy.  Ten years ago he had  diskectomy in [place] for right sciatica.  He has had an  appendectomy.   MEDICATIONS:  Coumadin for pulmonary emboli he had associated with his  colon operations.   ALLERGIES:  LATEX.   SOCIAL HISTORY:  He does not smoke, quit numerous years ago, drinks  alcohol on a limited social basis.   FAMILY HISTORY:  Both parents are 43 in good health with hypertension.   REVIEW OF SYSTEMS:  Remarkable for glasses, hearing loss,  hypercholesterolemia, colon cancer, leg pain.   PHYSICAL EXAMINATION:  HEENT:  Within normal limits.  NECK:  He has good range of motion in his neck.  CHEST:  Clear.  CARDIAC:  Regular rate and rhythm.  ABDOMEN:  Soft, nontender with no hepatosplenomegaly.  EXTREMITIES:  No clubbing or cyanosis.  GU:  Deferred.  Peripheral pulses are good.  NEUROLOGICAL:  He is awake, alert, and  oriented.  Cranial nerves are  intact.  Motor exam shows 5/5 strength throughout the upper and lower  extremities.  Sensory dysesthesias described in right L5 and S1  distribution, right much more prominent.  Reflexes are 1 at the knees,  absent at the ankles bilaterally.   MRI demonstrates bad spondylosis, very bad on the right not much on the  left and the significant scarring with this compression, it looks like  the right S1 root traverses this area.   CLINICAL IMPRESSION:  Lumbar spondylosis with right S1 radiculopathy.   PLAN:  Large foraminotomy, facetectomy on the right side TLIF type of  device and post arthrodesis in part compressing the left facet and  lamina as well as transverse processes.  The risks and benefits was  discussed with him and this includes coming off his Coumadin and he  wishes to proceed.            ______________________________  Loraine Leriche  Delora Fuel, M.D.     MWR/MEDQ  D:  10/02/2008  T:  10/02/2008  Job:  [id number]

## 2011-02-27 NOTE — Discharge Summary (Signed)
Cody Hernandez, Cody Hernandez                 ACCOUNT NO.:  [id number]   MEDICAL RECORD NO.:  [id number]          PATIENT TYPE:  INP   LOCATION:  1506                         [facility]AC[facility]   PHYSICIAN:  Anselm Pancoast. Weatherly, M.D.DATE OF BIRTH:  07/22/1956   DATE OF ADMISSION:  08/04/2006  DATE OF DISCHARGE:  08/30/2006                               DISCHARGE SUMMARY   DISCHARGE DIAGNOSES:  1. Small bowel obstruction, secondary adhesions with stricture      terminal ileum.  2. History of carcinoma of the rectum status post resection      approximately 1 year earlier.  3. Postoperative pulmonary embolus and he has had a previous pulmonary      embolus.   HISTORY:  Cody Hernandez. Cody Hernandez is a 55 year old Caucasian male who was  admitted through the ER on August 04, 2006 by Dr. Colin Benton. The patient  stated that he had a 9 hour history of vomiting and abdominal pain, and  had a bowel movement the previous day. He is approximately 1 year status  post a low anterior resection by Dr. Jerene Dilling in [place] with a J-patch  reconstruction and a diverted ileostomy. The patient had an ostomy  closure in April of 2007 and had a DVT and was placed on Coumadin, and  has had no previous problems with abdominal bloating and distention. The  patient has not resumed work after his surgeries. He is followed by Dr.  Arlan Organ who manages his Coumadin and also has seen him in  consultation for his rectal cancer. The patient underwent adjuvant  chemotherapy with XELOX and this was completed in February, 2007.   HOSPITAL COURSE:  The patient was admitted by Dr. Colin Benton, placed on  intravenous fluids and a nasogastric tube. He was started on heparin  5000 units SQ q.8 hours because of the history of the thrombophlebitis.  He had NG suctioning and said he was feeling better with the NG suction.  He was not having much flatus. Repeat KUB was interpreted to be slightly  improved and Dr. Colin Benton continued him on the NG suction,  IV fluids with  what was described as a small amount of flatus on the 28th. His abdomen  was described as being soft. His NG tube was removed on August 10, 2006. He had abdominal distention, repeat vomiting and was quite  nauseated on the following day. As I was on call that day, Dr. Colin Benton  asked if I could manage him since she had no time available for surgery  and I saw the patient, replaced the nasogastric tube; he was very  distended and I thought that he definitely needed to go for a  laparotomy. The patient stated that when his ileostomy had been taken  down in [place] back in the spring, he was described as having pretty  extensive adhesions. He had also taken about 2 weeks to start having  bowel function after the ileostomy take-down, and basically had received  heparin that morning.   We waited about 6 hours after the heparin had received and then took him  to  surgery late in the afternoon on August 11, 2006. He had extensive  adhesions, there was definitely a stricture in the terminal ileum about  1-1/2 feet proximal to where the ileostomy had been taken down  and also  had extensive adhesions up around the ligament of Treitz area, I am not  sure exactly what was the etiology of those. Dr. Jamey Ripa assisted with  the lysis of adhesions and stricture plasty. Postoperatively I wanted to  wait 24 hours before resuming the heparin since we had such extensive  lysis of adhesions, etc. However about 24 hours after his surgery, he  was up walking when he had the sudden onset of pleuritic chest pain.  Chest x-ray at that time was unremarkable but he was obviously short of  breath and a CT was obtained, and this was consistent with a pulmonary  embolus. The patient was transferred to the ICU. Oxygen saturations were  low and the patient was started on continuous heparin infusion. His  urine output was good. The tachycardia decreased. He was seen by  critical care and a 2D  echocardiogram performed. Basically over the next  48 hours his tachycardia and respiratory rate kind of returned to  normal. A Doppler study of the lower extremities was performed and  showed no evidence of any clots in the lower extremities. We think his  acute symptoms were related to the pulmonary embolus. He was a little  slow in having flatus; continued on the NG tube we had started him on  TPN as it had been a week when I first saw him. We had a PICC line  placed to start him on nutritional supplement the following day.  The  pulmonary critical care people signed off about 3 days afterwards. His  abdomen was becoming softer, occasionally a few bowel sounds, still a  lot of NG drainage. We started kind of clamping the NG tube, he was  still kind of mildly distended. The patient's Coumadin had been  restarted just as soon as we thought he was having some bowel function  and Dr. Myna Hidalgo was following his heparin, and wanted him definitely  bridged for a few days.  Dr. Myna Hidalgo then changed and said we could  switch him to Lovenox. The patient's mother who is a retired Engineer, civil (consulting) was  very upset over this since he was not completely anticoagulated because  of just starting the heparin. I met with her and his sister and they  were not aware of Lovenox and its place in the setting.  His NG drainage  definitely decreasing. He started having some flatus. We basically  clamped the NG tube for about a day. The patient did not want the NG  tube removed unless he knew it was not going to need to be restarted. We  were able to remove the NG tube on approximately the 15th. His Coumadin  had been started and he initially received about 12.5 on the first 2  days and Lovenox was being administered.  His bowels were sluggish but  did start and we gave him some milk of magnesia. He was up ambulating,  walking, etc. He had a follow-up CT of his chest on November 19 that showed that the embolus and no  effusion, everything was back to normal.  His white count was normal with a hematocrit of 28. His INR was still  approximately 1.9 and we continued with larger doses of Coumadin than he  had been on previously under  Dr. Gustavo Lah direction.  The patient's  PICC line was removed, he was eating, taking _________ p.o. and was  discharged on August 30, 2006. He had been receiving Lovenox and Dr.  Myna Hidalgo said we would not need to do the Lovenox by the visiting nurses  but he would see Dr. Myna Hidalgo in approximately 2 days and to continue on  the 12.5 mg of Coumadin for those 2 days.  He also had received some  Reglan which he can taper off. He has required only Tylenol for pain. He  will see me in follow up in approximately 2 weeks. The patient had no  evidence of any recurrent cancer at the time of his laparotomy and  hopefully will be able to return to work in approximately 6 weeks. He  has not worked since his low anterior resection, chemotherapy, the  ileostomy closure, etc., But hopes to return to work later this year of  definitely the first of January.           ______________________________  Anselm Pancoast. Zachery Dakins, M.D.     WJW/MEDQ  D:  09/22/2006  T:  09/22/2006  Job:  [id number]   cc:   Rose Phi. Myna Hidalgo, M.D.  Fax: [phone]

## 2011-02-27 NOTE — Consult Note (Signed)
NAMEISIDOR, BROMELL                 ACCOUNT NO.:  [id number]   MEDICAL RECORD NO.:  [id number]          PATIENT TYPE:  INP   LOCATION:  0157                         FACILITY:  [facility]   PHYSICIAN:  Rose Phi. Myna Hidalgo, M.D. DATE OF BIRTH:  [birth year]   DATE OF CONSULTATION:  08/13/2006  DATE OF DISCHARGE:                                   CONSULTATION   REFERRING PHYSICIAN:  Alfonse Ras, MD   REASON FOR CONSULTATION:  Recurrent pulmonary embolism and history of stage  II rectal cancer.   HISTORY OF PRESENT ILLNESS:  Mr. Habibi is a very nice, 55 year old, white  gentleman well-known to me.  He has history of stage II rectal cancer.  He  completed a neoadjuvant chemo-irradiation therapy.  He underwent surgery.  The then underwent adjuvant chemotherapy with XELOX.  This was completed in  February 2007.   He had a pulmonary embolism back in November 2006.  He has been on Coumadin.  He apparently was admitted because of bowel obstruction.  He was on subcu  Lovenox.  He also was given compression stockings and boots.  He had to go  to surgery for obstruction on October 31.  The surgery just found adhesions  and a stricture.   He subsequently began to have problems with chest pain and breathing.  He  was then transferred down to the ICU.  He had a CT scan looking for another  PE, unfortunately, he did have a PE.  He had clot in both main pulmonary  arteries.  He also had infarct in the right lower lobe.  Of note, he had a  recent CT scan on August 04, 2006.  The CT scan showed no evidence of  recurrent rectal cancer.  He did not have any obvious pulmonary embolism at  that time.  He is now on IV heparin.  We are asked to see him to help manage  his anticoagulation.   PAST MEDICAL HISTORY:  1. PE.  2. History of stage II rectal cancer.   ALLERGIES:  LASIX.   CURRENT MEDICATIONS:  1. IV heparin.  2. Xopenex nebulizer.  3. TNA.  4. Protonix 40 mg daily.   SOCIAL HISTORY:   Relatively unrevealing.  There is no history of alcohol or  any tobacco use.  He is a Corporate investment banker.   FAMILY HISTORY:  Negative for blood clots according to his mother.   REVIEW OF SYSTEMS:  As stated in the history of present illness.   PHYSICAL EXAMINATION:  GENERAL:  This is a somewhat ill-appearing, white  gentleman in no obvious distress.  He is on a face mask for breathing.  He  does answer questions.  VITAL SIGNS:  Temperature 97, pulse 117, respirations 16, blood pressure  128/83.  HEENT:  Normocephalic, atraumatic skull.  There are no ocular or oral  lesions.  Nonpalpable cervical or supraclavicular lymph nodes.  LUNGS:  Scattered wheezes and crackles bilaterally.  CARDIAC:  Tachycardic, but regular.  No murmurs, rubs or bruits.  ABDOMEN:  He has a laparotomy wound that his healing.  His  abdomen is  slightly tender.  No erythema is noted.  Decreased bowel sounds.  No obvious  hepatosplenomegaly.  EXTREMITIES:  No clubbing, cyanosis or edema.  I cannot palpate any venous  cord in his lower extremities.  NEUROLOGIC:  No focal neurological deficits.   LABORATORY DATA AND X-RAY FINDINGS:  Studies show a sodium of 129, potassium  4, BUN 9, creatinine 0.7, glucose 192.  Total protein 4.8, albumin 2.1.  His  white cell count 8.3, hemoglobin 11.8, hematocrit 33.6, platelet count 202.   IMPRESSION:  Mr. Finlay is a 55 year old gentleman with a pulmonary  embolism.  This has occurred yet once again in the face of surgery.  This  was not present when he had his CAT scan done on October 24.  This certainly  was surprising and shocking that he had the pulmonary embolism despite  anticoagulation.  He has clearly shown [place] that he is hypercoagulable.  We  have not yet uncovered a reason for this.  We will have to do routine  hypercoagulable studies to see if there is any predisposing factor.   He is going to need life-long anticoagulation.  I do not think it would be  safe to get  him off of anticoagulation at this standpoint.   RECOMMENDATIONS:  From my standpoint, I would favor a low-molecular weight  heparin for the next several weeks.  I just think that to get him on  Coumadin too soon will not allow the blood clot to actually resolve.  I  think if we do fine thrombus in his legs, then a filter would make sense.  If note, I am not sure how much a filter is going to accomplish as long as  he is on therapeutic anticoagulation.   I would not say that he is Coumadin resistant at this point.  Again, he was  on Coumadin for about a year and really had no problems at all.  I really  believe that he would benefit from a prolonged course of low-molecular  weight heparin.  Once he is stabilized a little bit more, we can get him on  Lovenox.  I just hate to put him on Lovenox right now with his recent  surgery and the risk for bleeding.  Heparin can be much more tightly  controlled.   We will follow Mr. Hoe along.  We will certainly provide input as need  be.      Rose Phi. Myna Hidalgo, M.D.  Electronically Signed     PRE/MEDQ  D:  08/13/2006  T:  08/14/2006  Job:  [id number]

## 2011-06-04 ENCOUNTER — Encounter: Payer: Self-pay | Admitting: Internal Medicine

## 2011-06-04 ENCOUNTER — Ambulatory Visit (INDEPENDENT_AMBULATORY_CARE_PROVIDER_SITE_OTHER): Payer: BC Managed Care – PPO | Admitting: Internal Medicine

## 2011-06-04 VITALS — BP 126/74 | HR 64 | Wt 177.8 lb

## 2011-06-04 DIAGNOSIS — G4733 Obstructive sleep apnea (adult) (pediatric): Secondary | ICD-10-CM

## 2011-06-04 NOTE — Assessment & Plan Note (Addendum)
We are seeking his original sleep study report from [facility]. He doesn't know his current setting, but is very faithful about using CPAP except he only wears it a few hours each night because the pressure seems uncomfortably low.  Plan- autotitration  [facility].

## 2011-06-04 NOTE — Progress Notes (Signed)
Subjective:    Patient ID: Cody Hernandez, male    DOB: 10/11/1956, 55 y.o.   MRN: [id number]  HPI 06/04/11- 15 yoM for OSA courtesy of Dr Leodis Sias.  Diagnostic NPSG 02/20/2009 recorded at an AHI of 22.2 per hour indicating moderate obstructive sleep apnea. This study was done because of morning headaches and daytime sleepiness. A CPAP titration study set pressure at 9 CWP. Since then he reports being very dependent on CPAP which has been a big help. He reports wearing it between 2 and 4 hours a night. Bedtime is 7:30 PM. He then wakes around midnight to put his CPAP on.. If he wears CPAP all night he doesn't sleep well. It seems to be waking him up. Ramp is set at 15 minutes and sleep onset is quick. 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 Medical treatment for DVT with pulmonary embolism x2, once after surgery and once after chemotherapy. He took Coumadin for 2 years and has completed. Over 5 years ago he had colon cancer treated with surgery chemotherapy and radiation with multiple associated surgeries. He denies heart or lung disease. Smoked one pack per day for 20 years quitting in 1994. Family history negative for others with sleep apnea but grandmother died of blood clot.   Review of Systems Constitutional:   No-   weight loss, night sweats, fevers, chills, fatigue, lassitude. HEENT:   No-  headaches, difficulty swallowing, tooth/dental problems, sore throat,       No-  sneezing, itching, ear ache, nasal congestion, post nasal drip,  CV:  No-   chest pain, orthopnea, PND, swelling in lower extremities, anasarca, dizziness, palpitations Resp: No-   shortness of breath with exertion or at rest.              No-   productive cough,  No non-productive cough,  No-  coughing up of blood.              No-   change in color of mucus.  No- wheezing.   Skin: No-   rash or lesions. GI:  No-   heartburn, indigestion, abdominal pain, nausea,  vomiting, diarrhea,                 change in bowel habits, loss of appetite GU: No-   dysuria, change in color of urine, no urgency or frequency.  No- flank pain. MS:  No-   joint pain or swelling.  No- decreased range of motion.  No- back pain. Neuro- grossly normal to observation, Or:  Psych:  No- change in mood or affect. No depression or anxiety.  No memory loss.      Objective:   Physical Exam General- Alert, Oriented, Affect-appropriate, Distress- none acute   medium build. Skin- rash-none, lesions- none, excoriation- none Lymphadenopathy- none Head- atraumatic            Eyes- Gross vision intact, PERRLA, conjunctivae clear secretions            Ears- Hearing, canals- decreased hearing            Nose- Clear, no-Septal dev, mucus, polyps, erosion, perforation             Throat- Mallampati III , mucosa clear , drainage- none, tonsils- atrophic Neck- flexible , trachea midline, no stridor , thyroid nl, carotid no bruit Chest - symmetrical excursion , unlabored  Heart/CV- RRR , no murmur , no gallop  , no rub, nl s1 s2                           - JVD- none , edema- none, stasis changes- none, varices- none           Lung- clear to P&A, wheeze- none, cough- none , dullness-none, rub- none           Chest wall-  Abd- tender-no, distended-no, bowel sounds-present, HSM- no Br/ Gen/ Rectal- Not done, not indicated Extrem- cyanosis- none, clubbing, none, atrophy- none, strength- nl Neuro- grossly intact to observation         Assessment & Plan:

## 2011-06-04 NOTE — Patient Instructions (Signed)
Order- [facility] ----  autotitrate for pressure recommendation. 5-20 cwp   X 2 weeks

## 2011-06-08 ENCOUNTER — Encounter: Payer: Self-pay | Admitting: Internal Medicine

## 2011-07-07 LAB — CBC
HCT: 42.2
Hemoglobin: 14.4
MCHC: 34.1
MCV: 87.1
RDW: 13.1

## 2011-07-17 ENCOUNTER — Encounter: Payer: Self-pay | Admitting: Internal Medicine

## 2011-07-17 ENCOUNTER — Ambulatory Visit (INDEPENDENT_AMBULATORY_CARE_PROVIDER_SITE_OTHER): Payer: BC Managed Care – PPO | Admitting: Internal Medicine

## 2011-07-17 VITALS — BP 128/90 | HR 61 | Ht 68.0 in | Wt 174.2 lb

## 2011-07-17 DIAGNOSIS — Z23 Encounter for immunization: Secondary | ICD-10-CM

## 2011-07-17 DIAGNOSIS — G4733 Obstructive sleep apnea (adult) (pediatric): Secondary | ICD-10-CM

## 2011-07-17 LAB — CBC
Hemoglobin: 14.5 g/dL (ref 13.0–17.0)
MCHC: 35.2 g/dL (ref 30.0–36.0)
Platelets: 256 10*3/uL (ref 150–400)
RDW: 12.2 % (ref 11.5–15.5)

## 2011-07-17 LAB — COMPREHENSIVE METABOLIC PANEL
ALT: 28 U/L (ref 0–53)
Albumin: 4.3 g/dL (ref 3.5–5.2)
BUN: 11 mg/dL (ref 6–23)
Calcium: 9.7 mg/dL (ref 8.4–10.5)
Glucose, Bld: 93 mg/dL (ref 70–99)
Potassium: 3.9 mEq/L (ref 3.5–5.1)
Sodium: 137 mEq/L (ref 135–145)
Total Protein: 6.6 g/dL (ref 6.0–8.3)

## 2011-07-17 LAB — URINALYSIS, ROUTINE W REFLEX MICROSCOPIC
Nitrite: NEGATIVE
Specific Gravity, Urine: 1.019 (ref 1.005–1.030)
Urobilinogen, UA: 0.2 mg/dL (ref 0.0–1.0)
pH: 6.5 (ref 5.0–8.0)

## 2011-07-17 LAB — TYPE AND SCREEN
ABO/RH(D): A POS
Antibody Screen: NEGATIVE

## 2011-07-17 LAB — DIFFERENTIAL
Lymphs Abs: 1.8 10*3/uL (ref 0.7–4.0)
Monocytes Absolute: 0.4 10*3/uL (ref 0.1–1.0)
Monocytes Relative: 6 % (ref 3–12)
Neutro Abs: 4.9 10*3/uL (ref 1.7–7.7)
Neutrophils Relative %: 69 % (ref 43–77)

## 2011-07-17 LAB — APTT: aPTT: 39 seconds — ABNORMAL HIGH (ref 24–37)

## 2011-07-17 LAB — PROTIME-INR
INR: 0.9 (ref 0.00–1.49)
INR: 2.3 — ABNORMAL HIGH (ref 0.00–1.49)

## 2011-07-17 NOTE — Progress Notes (Signed)
Subjective:    Patient ID: Cody Hernandez, male    DOB: 07/10/1956, 55 y.o.   MRN: [id number]  HPI 06/04/11- 70 yoM for OSA courtesy of Dr Leodis Sias.  Diagnostic NPSG 02/20/2009 recorded at an AHI of 22.2 per hour indicating moderate obstructive sleep apnea. This study was done because of morning headaches and daytime sleepiness. A CPAP titration study set pressure at 9 CWP. Since then he reports being very dependent on CPAP which has been a big help. He reports wearing it between 2 and 4 hours a night. Bedtime is 7:30 PM. He then wakes around midnight to put his CPAP on.. If he wears CPAP all night he doesn't sleep well. It seems to be waking him up. Ramp is set at 15 minutes and sleep onset is quick. 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 Medical treatment for DVT with pulmonary embolism x2, once after surgery and once after chemotherapy. He took Coumadin for 2 years and has completed. Over 5 years ago he had colon cancer treated with surgery chemotherapy and radiation with multiple associated surgeries. He denies heart or lung disease. Smoked one pack per day for 20 years quitting in 1994. Family history negative for others with sleep apnea but grandmother died of blood clot.  08/06/11- OSA, complicated by past history DVT/PE x2/ hx Coumadin, hx colon cancer/surgery/chemo/XRT He is using CPAP but still tends to take it off after a couple of hours. We tried to define whether it is uncomfortable in some way. NPSG- 02/20/09- AHI 22.2/hr; CPAP titrated to 9. Compliance report 06/25/11- good control on 9, but inadequate compliance.    Review of Systems Constitutional:   No-   weight loss, night sweats, fevers, chills, fatigue, lassitude. HEENT:   No-  headaches, difficulty swallowing, tooth/dental problems, sore throat,       No-  sneezing, itching, ear ache, nasal congestion, post nasal drip,  CV:  No-   chest pain, orthopnea, PND, swelling in  lower extremities, anasarca, dizziness, palpitations Resp: No-   shortness of breath with exertion or at rest.              No-   productive cough,  No non-productive cough,  No-  coughing up of blood.              No-   change in color of mucus.  No- wheezing.   Skin: No-   rash or lesions. GI:  No-   heartburn, indigestion, abdominal pain, nausea, vomiting, diarrhea,                 change in bowel habits, loss of appetite GU: No-   dysuria, change in color of urine, no urgency or frequency.  No- flank pain. MS:  No-   joint pain or swelling.  No- decreased range of motion.  No- back pain. Neuro- grossly normal to observation, Or:  Psych:  No- change in mood or affect. No depression or anxiety.  No memory loss.      Objective:   Physical Exam General- Alert, Oriented, Affect-appropriate, Distress- none acute   medium build. Skin- rash-none, lesions- none, excoriation- none Lymphadenopathy- none Head- atraumatic            Eyes- Gross vision intact, PERRLA, conjunctivae clear secretions            Ears- Hearing, canals- decreased hearing  Nose- Clear, no-Septal dev, mucus, polyps, erosion, perforation             Throat- Mallampati III , mucosa clear , drainage- none, tonsils- atrophic Neck- flexible , trachea midline, no stridor , thyroid nl, carotid no bruit Chest - symmetrical excursion , unlabored           Heart/CV- RRR , no murmur , no gallop  , no rub, nl s1 s2                           - JVD- none , edema- none, stasis changes- none, varices- none           Lung- clear to P&A, wheeze- none, cough- none , dullness-none, rub- none           Chest wall-  Abd- tender-no, distended-no, bowel sounds-present, HSM- no Br/ Gen/ Rectal- Not done, not indicated Extrem- cyanosis- none, clubbing, none, atrophy- none, strength- nl Neuro- grossly intact to observation, alert         Assessment & Plan:

## 2011-07-17 NOTE — Patient Instructions (Signed)
Order- [facility]- increase CPAP to 10 cwp  Our goal with CPAP is "all night, every night"  Flu vax

## 2011-07-19 NOTE — Assessment & Plan Note (Signed)
His pressure of 9 is adequate. We need to help him improve compliance effective long-term treatment. Time was spent today reviewing comfort issues, medical indications for CPAP, comparison with other therapies, answering questions.

## 2011-08-14 ENCOUNTER — Encounter (HOSPITAL_BASED_OUTPATIENT_CLINIC_OR_DEPARTMENT_OTHER): Payer: BC Managed Care – PPO | Admitting: Hematology & Oncology

## 2011-08-14 ENCOUNTER — Other Ambulatory Visit: Payer: Self-pay | Admitting: Hematology & Oncology

## 2011-08-14 DIAGNOSIS — Z86718 Personal history of other venous thrombosis and embolism: Secondary | ICD-10-CM

## 2011-08-14 DIAGNOSIS — C20 Malignant neoplasm of rectum: Secondary | ICD-10-CM

## 2011-08-14 LAB — CBC WITH DIFFERENTIAL (CANCER CENTER ONLY)
BASO#: 0 10*3/uL (ref 0.0–0.2)
Eosinophils Absolute: 0.1 10*3/uL (ref 0.0–0.5)
HCT: 40.3 % (ref 38.7–49.9)
HGB: 14.7 g/dL (ref 13.0–17.1)
MCH: 31.3 pg (ref 28.0–33.4)
MCV: 86 fL (ref 82–98)
MONO%: 8.7 % (ref 0.0–13.0)
NEUT#: 2.7 10*3/uL (ref 1.5–6.5)
NEUT%: 49 % (ref 40.0–80.0)
RBC: 4.7 10*6/uL (ref 4.20–5.70)

## 2011-08-15 LAB — COMPREHENSIVE METABOLIC PANEL
ALT: 25 U/L (ref 0–53)
Albumin: 5 g/dL (ref 3.5–5.2)
CO2: 27 mEq/L (ref 19–32)
Chloride: 103 mEq/L (ref 96–112)
Glucose, Bld: 94 mg/dL (ref 70–99)
Potassium: 4.4 mEq/L (ref 3.5–5.3)
Sodium: 140 mEq/L (ref 135–145)
Total Bilirubin: 0.5 mg/dL (ref 0.3–1.2)
Total Protein: 7.5 g/dL (ref 6.0–8.3)

## 2011-08-15 LAB — CEA: CEA: 0.6 ng/mL (ref 0.0–5.0)

## 2011-08-15 LAB — LACTATE DEHYDROGENASE: LDH: 157 U/L (ref 94–250)

## 2011-08-17 NOTE — Progress Notes (Signed)
DIAGNOSES: 1. Stage II (T3 N0 M0) adenocarcinoma of the rectum. 2. Pulmonary embolism.  CURRENT THERAPY:  Observation.  INTERIM HISTORY:  [name] comes in for a 6 month followup.  He is doing okay.  His wife is now down in this area.  She was working up in [place].  [name] was "commuting" all the time to see her.  He is feeling well.  There has been no change in his medications.  He continues on Lipitor.  His cholesterol is 130.  When we last saw him in May his CEA was 0.9.  He has not had any problems with abdominal pain.  He has had chronic back issues.  He has had back surgery under the care Dr. Trey Hernandez.  He has not noted any leg swelling.  There has been no leg pain.  He has had no cough or shortness of breath.  He has had no issues with fever.  PHYSICAL EXAMINATION:  General:  This is a well-developed, well- nourished white gentleman in no obvious distress.  Vital signs:  Show temperature of 97, pulse 60, respiratory rate 18, blood pressure 134/80. Weight is 176.  Head and neck:  Exam shows normocephalic, atraumatic skull.  There are no ocular or oral lesions.  Lymphs:  There are no palpable cervical, supraclavicular lymph nodes.  Lungs:  Are clear bilaterally.  Cardiac:  Regular rate and rhythm with normal S1, S2. There are no murmurs or rubs or bruits.  Abdomen:  Soft with good bowel sounds.  There is well-healed laparotomy wound.  There is no fluid wave. There is no guarding or rebound tenderness.  There is no palpable hepatosplenomegaly.  Back:  No tenderness of the spine, ribs or hips. Extremities:  Shows no clubbing, cyanosis or edema.  Skin:  No rashes, ecchymosis or petechiae.  LABORATORY STUDIES:  White cell count is 5.4, hemoglobin 14.7, hematocrit 40.3, platelet count 252.  IMPRESSION:  [name] is a 55 year old gentleman with history of stage II adenocarcinoma of the rectum.  He was treated with multimodality therapy.  He completed all his therapy  back in 11/2005.  I do not see any evidence of recurrent disease.  I do not see any need for scans. Care back in February 2007.  Other vitals, see any evidence of recurrent disease.  I do not see any need for scans.  We will get Cody Hernandez back to see [place] in 6 more months.  At some point, we can probably get him back to see [place] yearly.   ______________________________ Cody Hernandez, M.D. PRE/MEDQ  D:  08/14/2011  T:  08/17/2011  Job:  [id number]

## 2011-12-21 ENCOUNTER — Telehealth: Payer: Self-pay | Admitting: Hematology & Oncology

## 2011-12-21 NOTE — Telephone Encounter (Signed)
Pt moved 5-23 to 4-9

## 2012-01-19 ENCOUNTER — Ambulatory Visit (HOSPITAL_BASED_OUTPATIENT_CLINIC_OR_DEPARTMENT_OTHER): Payer: 59 | Admitting: Hematology & Oncology

## 2012-01-19 ENCOUNTER — Other Ambulatory Visit (HOSPITAL_BASED_OUTPATIENT_CLINIC_OR_DEPARTMENT_OTHER): Payer: 59 | Admitting: Lab

## 2012-01-19 VITALS — BP 142/86 | HR 71 | Temp 97.6°F | Ht 68.0 in | Wt 174.0 lb

## 2012-01-19 DIAGNOSIS — I2699 Other pulmonary embolism without acute cor pulmonale: Secondary | ICD-10-CM

## 2012-01-19 DIAGNOSIS — C20 Malignant neoplasm of rectum: Secondary | ICD-10-CM

## 2012-01-19 LAB — CBC WITH DIFFERENTIAL (CANCER CENTER ONLY)
Eosinophils Absolute: 0 10*3/uL (ref 0.0–0.5)
HCT: 40.6 % (ref 38.7–49.9)
LYMPH%: 34.9 % (ref 14.0–48.0)
MCH: 30.7 pg (ref 28.0–33.4)
MCV: 87 fL (ref 82–98)
MONO#: 0.5 10*3/uL (ref 0.1–0.9)
MONO%: 7.7 % (ref 0.0–13.0)
NEUT%: 56.4 % (ref 40.0–80.0)
RDW: 12.8 % (ref 11.1–15.7)
WBC: 5.9 10*3/uL (ref 4.0–10.0)

## 2012-01-19 NOTE — Progress Notes (Signed)
DIAGNOSES: 1. History of stage II (T3 N0 M0) adenocarcinoma of the rectum. 2. History of pulmonary embolism, postop.  CURRENT THERAPY:  Aspirin 81 mg p.o. daily.  INTERIM HISTORY:  [name] comes in for his followup.  He now lives down in [place].  He is working close to [employer].  Thankfully, his wife is down in [place] working in [place].  He dries every weekend 6 hours to see her.  He is doing okay.  His back continues to cause some issues for him.  This is not a new problem for him, however.  We last saw him back in November.  He has been doing okay.  His CEA at that time was 0.6.  His PSA was 1.3.  He is having no problems going to the bathroom.  He has had no abdominal pain.  He has had no leg swelling.  He has had a good appetite.  He has had no cough or shortness of breath.  PHYSICAL EXAMINATION:  General:  This is a well-developed, well- nourished white gentleman in no obvious distress.  Vital signs:  97.6, pulse 71, respiratory rate 18, blood pressure 142/86.  Weight is 174. Head and neck:  Exam shows a normocephalic, atraumatic skull.  There are no ocular or oral lesions.  There are no palpable cervical, supraclavicular lymph nodes.  Lungs:  Clear bilaterally.  Cardiac: Regular rate and rhythm with a normal S1 and S2.  There are no murmurs, rubs or bruits.  Abdomen:  Soft with good bowel sounds.  There is no palpable abdominal mass.  There is no palpable hepatosplenomegaly. Back:  No tenderness over the spine, ribs or hips.  Extremities:  Shows no clubbing, cyanosis or edema.  He has good range of motion of his joints.  LABORATORY STUDIES:  White cell count 5.9, hemoglobin 14.4, hematocrit 40.6, platelet count 233.  IMPRESSION:  [name] is a nice 56 year old gentleman with history of stage II adenocarcinoma of the rectum.  He was treated with chemo/radiation therapy.  He then had full dose chemotherapy.  He completed all his treatments back in February of  2007.  To date, there has been no evidence of recurrence and he is now 6 years out.  He was treated for pulmonary embolism for a good 2 years.  He is on aspirin right now.  We will plan to get him back once a year now.  Again, I think that his risk of recurrence is going to be less than 10%.    ______________________________ Josph Macho, M.D. PRE/MEDQ  D:  01/19/2012  T:  01/19/2012  Job:  [id number]

## 2012-01-19 NOTE — Progress Notes (Signed)
This office note has been dictated.

## 2012-01-20 LAB — COMPREHENSIVE METABOLIC PANEL
ALT: 21 U/L (ref 0–53)
AST: 23 U/L (ref 0–37)
Albumin: 4.8 g/dL (ref 3.5–5.2)
Alkaline Phosphatase: 52 U/L (ref 39–117)
BUN: 16 mg/dL (ref 6–23)
CO2: 27 mEq/L (ref 19–32)
Calcium: 9.5 mg/dL (ref 8.4–10.5)
Chloride: 104 mEq/L (ref 96–112)
Creatinine, Ser: 0.74 mg/dL (ref 0.50–1.35)
Glucose, Bld: 91 mg/dL (ref 70–99)
Potassium: 4.5 mEq/L (ref 3.5–5.3)
Sodium: 140 mEq/L (ref 135–145)
Total Bilirubin: 0.5 mg/dL (ref 0.3–1.2)
Total Protein: 7.1 g/dL (ref 6.0–8.3)

## 2012-01-20 LAB — LACTATE DEHYDROGENASE: LDH: 149 U/L (ref 94–250)

## 2012-01-25 ENCOUNTER — Telehealth: Payer: Self-pay | Admitting: *Deleted

## 2012-01-25 NOTE — Telephone Encounter (Addendum)
Message copied by Mirian Capuchin on Mon Jan 25, 2012  2:27 PM ------      Message from: Arlan Organ R      Created: Wed Jan 20, 2012  9:27 PM       Call - labs ok.  Pete Pt given this message.  Voiced understanding.

## 2012-02-26 ENCOUNTER — Ambulatory Visit: Payer: BC Managed Care – PPO | Admitting: Hematology & Oncology

## 2012-02-26 ENCOUNTER — Other Ambulatory Visit: Payer: BC Managed Care – PPO | Admitting: Lab

## 2012-08-08 DIAGNOSIS — Z85048 Personal history of other malignant neoplasm of rectum, rectosigmoid junction, and anus: Secondary | ICD-10-CM | POA: Insufficient documentation

## 2012-10-21 ENCOUNTER — Encounter: Payer: Self-pay | Admitting: *Deleted

## 2012-10-21 NOTE — Progress Notes (Signed)
Pt called to report he was in [place] and went to the ER with chest pain.  They did a CT of the chest and gave him the report.  He sent report to his primary care MD in [facility] and that MD told him to call Dr. Myna Hidalgo right awary.  Pt faxed Dr. Myna Hidalgo a report of the CT scan.  Dr. Myna Hidalgo compared that report with the last Ct report we had (03/2010) and it had not changed.  Per Dr. Myna Hidalgo pt reassured that nothing had changed, he was ok and we would see him on his next appt in April.  Pt voiced understanding.

## 2013-01-12 ENCOUNTER — Telehealth: Payer: Self-pay | Admitting: Hematology & Oncology

## 2013-01-12 NOTE — Telephone Encounter (Signed)
Pt cx 4-9 wanted to reschedule to 5-5 or 5-6, no openings. He was transferred to [facility].

## 2013-01-16 ENCOUNTER — Ambulatory Visit: Payer: Self-pay | Admitting: Family Medicine

## 2013-01-18 ENCOUNTER — Ambulatory Visit: Payer: 59 | Admitting: Hematology & Oncology

## 2013-01-18 ENCOUNTER — Other Ambulatory Visit: Payer: 59 | Admitting: Lab

## 2013-01-22 ENCOUNTER — Other Ambulatory Visit: Payer: Self-pay | Admitting: Family Medicine

## 2013-02-14 ENCOUNTER — Ambulatory Visit: Payer: Self-pay | Admitting: Family Medicine

## 2013-05-15 ENCOUNTER — Ambulatory Visit (INDEPENDENT_AMBULATORY_CARE_PROVIDER_SITE_OTHER): Payer: 59

## 2013-05-15 ENCOUNTER — Ambulatory Visit (INDEPENDENT_AMBULATORY_CARE_PROVIDER_SITE_OTHER): Payer: 59 | Admitting: Family Medicine

## 2013-05-15 VITALS — BP 128/64 | HR 68 | Temp 97.4°F | Ht 68.0 in | Wt 176.0 lb

## 2013-05-15 DIAGNOSIS — S8010XA Contusion of unspecified lower leg, initial encounter: Secondary | ICD-10-CM

## 2013-05-15 DIAGNOSIS — E785 Hyperlipidemia, unspecified: Secondary | ICD-10-CM

## 2013-05-15 DIAGNOSIS — S8011XA Contusion of right lower leg, initial encounter: Secondary | ICD-10-CM

## 2013-05-15 NOTE — Progress Notes (Signed)
Patient ID: Cody Hernandez, male   DOB: 11/11/1955, 57 y.o.   MRN: [id number] SUBJECTIVE: CC: Chief Complaint  Patient presents with  . Leg Pain    right lower- yellowish color- sore on friday ran into coffee table. on BP med unsure of name and states never have rx [place]     HPI: As above. The yellow green color of his  Skin made him concerned.  Patient is also here for follow up of hyperlipidemia: denies Headache;denies Chest Pain;denies weakness;denies Shortness of Breath and orthopnea;denies Visual changes;denies palpitations;denies cough;denies pedal edema;denies symptoms of TIA or stroke;deniesClaudication symptoms. admits to Compliance with medications; denies Problems with medications.   Past Medical History  Diagnosis Date  . High cholesterol   . Blood clotting disorder     DVT/PE x2 Coumadin x 2 years 2006  . Colon cancer   . Sleep apnea   . Pulmonary embolism    Past Surgical History  Procedure Laterality Date  . Neck/back surgery  1995 and 2010  . Colon surgery  2007  . Appendectomy  1977   History   Social History  . Marital Status: Married    Spouse Name: N/A    Number of Children: 0  . Years of Education: N/A   Occupational History  .     Social History Main Topics  . Smoking status: Former Smoker -- 1.00 packs/day for 20 years    Types: Cigarettes    Quit date: 10/12/1992  . Smokeless tobacco: Not on file  . Alcohol Use: 0.0 oz/week     Comment: 8oz  . Drug Use: No  . Sexually Active: Not on file   Other Topics Concern  . Not on file   Social History Narrative  . No narrative on file   Family History  Problem Relation Age of Onset  . Emphysema Paternal Grandfather   . Allergies Sister   . Allergies Mother   . Pulmonary embolism Paternal Grandmother    Current Outpatient Prescriptions on File Prior to Visit  Medication Sig Dispense Refill  . aspirin 81 MG tablet Take 81 mg by mouth daily.        Marland Kitchen atorvastatin  (LIPITOR) 10 MG tablet Take 20 mg by mouth Daily.       Marland Kitchen CIALIS 20 MG tablet TAKE AS DIRECTED  12 tablet  0   No current facility-administered medications on file prior to visit.   Allergies  Allergen Reactions  . Benadryl (Diphenhydramine Hcl)     Nerves "crawling" feelings   Immunization History  Administered Date(s) Administered  . Influenza Split 07/17/2011   Prior to Admission medications   Medication Sig Start Date End Date Taking? Authorizing Provider  aspirin 81 MG tablet Take 81 mg by mouth daily.     Yes Historical Provider, MD  atorvastatin (LIPITOR) 10 MG tablet Take 20 mg by mouth Daily.  05/23/11  Yes Historical Provider, MD  CIALIS 20 MG tablet TAKE AS DIRECTED 01/22/13   Ileana Ladd, MD    ROS: As above in the HPI. All other systems are stable or negative.  OBJECTIVE: APPEARANCE:  Patient in no acute distress.The patient appeared well nourished and normally developed. Acyanotic. Waist: VITAL SIGNS: BP 128/64  Pulse 68  Temp(Src) 97.4 F (36.3 C) (Oral)  Ht 5\' 8"  (1.727 m)  Wt 176 lb (79.833 kg)  BMI 26.77 kg/m2 WM  SKIN: warm and  Dry without overt rashes, tattoos and scars abrasion right upper shin  healing, yellow -green discoloration of the skin. Homan's negative.  HEAD and Neck: without JVD, Head and scalp: normal Eyes:No scleral icterus. Fundi normal, eye movements normal. Ears: Auricle normal, canal normal, Tympanic membranes normal, insufflation normal. Nose: normal Throat: normal Neck & thyroid: normal  CHEST & LUNGS: Chest wall: normal Lungs: Clear  CVS: Reveals the PMI to be normally located. Regular rhythm, First and Second Heart sounds are normal,  absence of murmurs, rubs or gallops. Peripheral vasculature: Radial pulses: normal Dorsal pedis pulses: normal Posterior pulses: normal  ABDOMEN:  Appearance: normal Benign, no organomegaly, no masses, no Abdominal Aortic enlargement. No Guarding , no rebound. No Bruits. Bowel  sounds: normal  RECTAL: N/A GU: N/A  EXTREMETIES: trace edematous. Both Femoral and Pedal pulses are normal.  MUSCULOSKELETAL:  Spine: normal Joints: intact Right lower extremity as described  NEUROLOGIC: oriented to time,place and person; nonfocal. Strength is normal Sensory is normal Reflexes are normal Cranial Nerves are normal.  ASSESSMENT: Contusion of leg, right, initial encounter - Plan: DG Tibia/Fibula Right  HLD (hyperlipidemia) - Plan: CMP14+EGFR, NMR, lipoprofile   PLAN: Orders Placed This Encounter  Procedures  . DG Tibia/Fibula Right    Standing Status: Future     Number of Occurrences: 1     Standing Expiration Date: 07/15/2014    Order Specific Question:  Reason for Exam (SYMPTOM  OR DIAGNOSIS REQUIRED)    Answer:  contusion right leg r/o fracture    Order Specific Question:  Preferred imaging location?    Answer:  Internal  . CMP14+EGFR  . NMR, lipoprofile   [facility] reading (PRIMARY) by  Dr. Modesto Charon: No fracture seen  Elevate  Ice Observe. Reassurance.  Return in about 4 months (around 09/14/2013) for Recheck medical problems.  Teigan Sahli P. Modesto Charon, M.D.

## 2013-05-17 LAB — CMP14+EGFR
ALT: 26 IU/L (ref 0–44)
AST: 23 IU/L (ref 0–40)
Albumin/Globulin Ratio: 1.6 (ref 1.1–2.5)
Albumin: 4.9 g/dL (ref 3.5–5.5)
Alkaline Phosphatase: 69 IU/L (ref 39–117)
BUN/Creatinine Ratio: 19 (ref 9–20)
BUN: 14 mg/dL (ref 6–24)
CO2: 27 mmol/L (ref 18–29)
Calcium: 9.8 mg/dL (ref 8.7–10.2)
Chloride: 98 mmol/L (ref 97–108)
Creatinine, Ser: 0.75 mg/dL — ABNORMAL LOW (ref 0.76–1.27)
GFR calc Af Amer: 119 mL/min/{1.73_m2} (ref >59)
GFR calc non Af Amer: 103 mL/min/{1.73_m2} (ref >59)
Globulin, Total: 3 g/dL (ref 1.5–4.5)
Glucose: 87 mg/dL (ref 65–99)
Potassium: 4.6 mmol/L (ref 3.5–5.2)
Sodium: 138 mmol/L (ref 134–144)
Total Bilirubin: 0.7 mg/dL (ref 0.0–1.2)
Total Protein: 7.9 g/dL (ref 6.0–8.5)

## 2013-05-17 LAB — NMR, LIPOPROFILE
Cholesterol: 197 mg/dL (ref <200)
HDL Cholesterol by NMR: 48 mg/dL (ref >=40)
HDL Particle Number: 37.5 umol/L (ref >=30.5)
LDL Particle Number: 1811 nmol/L — ABNORMAL HIGH (ref <1000)
LDL Size: 20.5 nm — ABNORMAL LOW (ref >20.5)
LDLC SERPL CALC-MCNC: 110 mg/dL — ABNORMAL HIGH (ref <100)
LP-IR Score: 68 — ABNORMAL HIGH (ref <=45)
Small LDL Particle Number: 765 nmol/L — ABNORMAL HIGH (ref <=527)
Triglycerides by NMR: 194 mg/dL — ABNORMAL HIGH (ref <150)

## 2013-05-22 ENCOUNTER — Ambulatory Visit (INDEPENDENT_AMBULATORY_CARE_PROVIDER_SITE_OTHER): Payer: 59

## 2013-05-22 ENCOUNTER — Ambulatory Visit (INDEPENDENT_AMBULATORY_CARE_PROVIDER_SITE_OTHER): Payer: 59 | Admitting: Family Medicine

## 2013-05-22 ENCOUNTER — Encounter: Payer: Self-pay | Admitting: Family Medicine

## 2013-05-22 VITALS — BP 114/77 | HR 60 | Temp 97.7°F | Wt 181.0 lb

## 2013-05-22 DIAGNOSIS — R9389 Abnormal findings on diagnostic imaging of other specified body structures: Secondary | ICD-10-CM

## 2013-05-22 DIAGNOSIS — I10 Essential (primary) hypertension: Secondary | ICD-10-CM

## 2013-05-22 DIAGNOSIS — R936 Abnormal findings on diagnostic imaging of limbs: Secondary | ICD-10-CM

## 2013-05-22 MED ORDER — AMLODIPINE BESYLATE 5 MG PO TABS: 5.0000 mg | ORAL_TABLET | Freq: Every day | ORAL | Status: DC

## 2013-05-22 NOTE — Progress Notes (Signed)
Patient ID: Cody Hernandez, male   DOB: [birth year], 57 y.o.   MRN: [id number] SUBJECTIVE: CC: Chief Complaint  Patient presents with  . Follow-up    RECK RT LEG STATES BETTER STILL SOME BRUISING  . Medication Refill    amlodipine    HPI: Called in to recheck leg because radiologist thought that the tibia could have an undisplaced fracture. His leg feels much better. He is able to walk on it without major discomfort and the yellow-green discoloration is resolving.  Patient is here for follow up of hypertension: denies Headache;deniesChest Pain;denies weakness;denies Shortness of Breath or Orthopnea;denies Visual changes;denies palpitations;denies cough;denies pedal edema;denies symptoms of TIA or stroke; admits to Compliance with medications. denies Problems with medications. Needs refill the amlodipine.  Past Medical History  Diagnosis Date  . High cholesterol   . Blood clotting disorder     DVT/PE x2 Coumadin x 2 years 2006  . Colon cancer   . Sleep apnea   . Pulmonary embolism    Past Surgical History  Procedure Laterality Date  . Neck/back surgery  1995 and 2010  . Colon surgery  2007  . Appendectomy  1977   History   Social History  . Marital Status: Married    Spouse Name: N/A    Number of Children: 0  . Years of Education: N/A   Occupational History  .     Social History Main Topics  . Smoking status: Former Smoker -- 1.00 packs/day for 20 years    Types: Cigarettes    Quit date: 10/12/1992  . Smokeless tobacco: Not on file  . Alcohol Use: 0.0 oz/week     Comment: 8oz  . Drug Use: No  . Sexually Active: Not on file   Other Topics Concern  . Not on file   Social History Narrative  . No narrative on file   Family History  Problem Relation Age of Onset  . Emphysema Paternal Grandfather   . Allergies Sister   . Allergies Mother   . Pulmonary embolism Paternal Grandmother    Current Outpatient Prescriptions on File Prior to Visit  Medication Sig  Dispense Refill  . aspirin 81 MG tablet Take 81 mg by mouth daily.        Marland Kitchen atorvastatin (LIPITOR) 10 MG tablet Take 20 mg by mouth Daily.       Marland Kitchen CIALIS 20 MG tablet TAKE AS DIRECTED  12 tablet  0   No current facility-administered medications on file prior to visit.   Allergies  Allergen Reactions  . Benadryl (Diphenhydramine Hcl)     Nerves "crawling" feelings   Immunization History  Administered Date(s) Administered  . Influenza Split 07/17/2011  . Tdap 08/09/2012   Prior to Admission medications   Medication Sig Start Date End Date Taking? Authorizing Provider  amLODipine (NORVASC) 5 MG tablet Take 1 tablet (5 mg total) by mouth daily. 05/22/13   Ileana Ladd, MD  aspirin 81 MG tablet Take 81 mg by mouth daily.      Historical Provider, MD  atorvastatin (LIPITOR) 10 MG tablet Take 20 mg by mouth Daily.  05/23/11   Historical Provider, MD  CIALIS 20 MG tablet TAKE AS DIRECTED 01/22/13   Ileana Ladd, MD     ROS: As above in the HPI. All other systems are stable or negative.  OBJECTIVE: APPEARANCE:  Patient in no acute distress.The patient appeared well nourished and normally developed. Acyanotic. Waist: VITAL SIGNS:BP 114/77  Pulse 60  Temp(Src)  97.7 F (36.5 C) (Oral)  Wt 181 lb (82.101 kg)  BMI 27.53 kg/m2 Wm  SKIN: warm and  Dry without overt rashes, tattoos and scars  HEAD and Neck: without JVD, Head and scalp: normal Eyes:No scleral icterus. Fundi normal, eye movements normal. Ears: Auricle normal, canal normal, Tympanic membranes normal, insufflation normal. Nose: normal Throat: normal Neck & thyroid: normal  CHEST & LUNGS: Chest wall: normal Lungs: Clear  CVS: Reveals the PMI to be normally located. Regular rhythm, First and Second Heart sounds are normal,  absence of murmurs, rubs or gallops. Peripheral vasculature: Radial pulses: normal Dorsal pedis pulses: normal Posterior pulses: normal  ABDOMEN:  Appearance: normal Benign, no  organomegaly, no masses, no Abdominal Aortic enlargement. No Guarding , no rebound. No Bruits. Bowel sounds: normal  RECTAL: N/A GU: N/A  EXTREMETIES: nonedematous. The right leg ecchymosis is 75% improved/resolved. No temderness Both Femoral and Pedal pulses are normal.  MUSCULOSKELETAL:  Spine: normal Joints: intact  NEUROLOGIC: oriented to time,place and person; nonfocal. Strength is normal Sensory is normal  Results for orders placed in visit on 05/15/13  CMP14+EGFR      Result Value Range   Glucose 87  65 - 99 mg/dL   BUN 14  6 - 24 mg/dL   Creatinine, Ser 0.45 (*) 0.76 - 1.27 mg/dL   GFR calc non Af Amer 103  >59 mL/min/1.73   GFR calc Af Amer 119  >59 mL/min/1.73   BUN/Creatinine Ratio 19  9 - 20   Sodium 138  134 - 144 mmol/L   Potassium 4.6  3.5 - 5.2 mmol/L   Chloride 98  97 - 108 mmol/L   CO2 27  18 - 29 mmol/L   Calcium 9.8  8.7 - 10.2 mg/dL   Total Protein 7.9  6.0 - 8.5 g/dL   Albumin 4.9  3.5 - 5.5 g/dL   Globulin, Total 3.0  1.5 - 4.5 g/dL   Albumin/Globulin Ratio 1.6  1.1 - 2.5   Total Bilirubin 0.7  0.0 - 1.2 mg/dL   Alkaline Phosphatase 69  39 - 117 IU/L   AST 23  0 - 40 IU/L   ALT 26  0 - 44 IU/L  NMR, LIPOPROFILE      Result Value Range   LDL Particle Number [id number] (*) <1000 nmol/L   LDLC SERPL CALC-MCNC 110 (*) <100 mg/dL   HDL Cholesterol by NMR 48  >=40 mg/dL   Triglycerides by NMR 194 (*) <150 mg/dL   Cholesterol 409  <811 mg/dL   HDL Particle Number 91.4  >=78.2 umol/L   Small LDL Particle Number [id number] (*) <=527 nmol/L   LDL Size 20.5 (*) >20.5 nm   LP-IR Score 68 (*) <=45    ASSESSMENT: HTN (hypertension) - Plan: amLODipine (NORVASC) 5 MG tablet  Abnormal x-ray of lower extremity - Plan: DG Tibia/Fibula Right  PLAN: Reviewed the previous leg Xray Reviewed the labs with patient and reinforced the treatment plan.  [facility] reading (PRIMARY) by  Dr. Modesto Charon  : no fracture seen.                  Meds ordered this encounter  Medications  .  DISCONTD: amLODipine (NORVASC) 5 MG tablet    Sig: Take 5 mg by mouth daily.   Marland Kitchen amLODipine (NORVASC) 5 MG tablet    Sig: Take 1 tablet (5 mg total) by mouth daily.    Dispense:  90 tablet    Refill:  3   Orders  Placed This Encounter  Procedures  . DG Tibia/Fibula Right    Standing Status: Future     Number of Occurrences: 1     Standing Expiration Date: 07/22/2014    Order Specific Question:  Reason for Exam (SYMPTOM  OR DIAGNOSIS REQUIRED)    Answer:  follow up abnormal Xray    Order Specific Question:  Preferred imaging location?    Answer:  Internal   Return as planned, for Recheck medical problems.  keep his original follow up  Nikkole Placzek P. Modesto Charon, M.D.

## 2013-06-20 ENCOUNTER — Other Ambulatory Visit: Payer: Self-pay | Admitting: Family Medicine

## 2013-06-22 NOTE — Telephone Encounter (Signed)
Prescription renewed in EPIC. 

## 2013-06-22 NOTE — Telephone Encounter (Signed)
LAST LIPIDS 8/14. EPIC HAS 10MG  BUT PAPER CHART HAS 40MG . PLEASE REVIEW. PRINT AND CALL PT WHEN READY. THANKS.

## 2013-09-15 ENCOUNTER — Ambulatory Visit: Payer: 59 | Admitting: Family Medicine

## 2013-10-04 ENCOUNTER — Ambulatory Visit (INDEPENDENT_AMBULATORY_CARE_PROVIDER_SITE_OTHER): Payer: 59 | Admitting: Family Medicine

## 2013-10-04 ENCOUNTER — Encounter: Payer: Self-pay | Admitting: Family Medicine

## 2013-10-04 VITALS — BP 129/84 | HR 86 | Temp 98.8°F | Ht 68.0 in | Wt 182.2 lb

## 2013-10-04 DIAGNOSIS — E7800 Pure hypercholesterolemia, unspecified: Secondary | ICD-10-CM | POA: Insufficient documentation

## 2013-10-04 DIAGNOSIS — D689 Coagulation defect, unspecified: Secondary | ICD-10-CM | POA: Insufficient documentation

## 2013-10-04 DIAGNOSIS — I2699 Other pulmonary embolism without acute cor pulmonale: Secondary | ICD-10-CM | POA: Insufficient documentation

## 2013-10-04 DIAGNOSIS — C189 Malignant neoplasm of colon, unspecified: Secondary | ICD-10-CM | POA: Insufficient documentation

## 2013-10-04 DIAGNOSIS — I10 Essential (primary) hypertension: Secondary | ICD-10-CM | POA: Insufficient documentation

## 2013-10-04 DIAGNOSIS — J309 Allergic rhinitis, unspecified: Secondary | ICD-10-CM | POA: Insufficient documentation

## 2013-10-04 DIAGNOSIS — G4733 Obstructive sleep apnea (adult) (pediatric): Secondary | ICD-10-CM

## 2013-10-04 DIAGNOSIS — G4730 Sleep apnea, unspecified: Secondary | ICD-10-CM | POA: Insufficient documentation

## 2013-10-04 MED ORDER — FLUTICASONE PROPIONATE 50 MCG/ACT NA SUSP
2.0000 | Freq: Every day | NASAL | Status: DC
Start: 2013-10-04 — End: 2014-04-30

## 2013-10-04 MED ORDER — FLUTICASONE PROPIONATE 50 MCG/ACT NA SUSP: 2.0000 | Freq: Every day | NASAL | Status: DC

## 2013-10-04 NOTE — Progress Notes (Signed)
Patient ID: Cody Hernandez, male   DOB: August 17, 1956, 57 y.o.   MRN: [id number] SUBJECTIVE: CC: Chief Complaint  Patient presents with  . Follow-up    4 month follow up follow up cholesterol  c/o cold symptoms.states not taking BP ,med    HPI: Has a URI . Nasal congestion for weeks. No fever no cough no SOB   Patient is here for follow up of hyperlipidemia/HTN: denies Headache;denies Chest Pain;denies weakness;denies Shortness of Breath and orthopnea;denies Visual changes;denies palpitations;denies cough;denies pedal edema;denies symptoms of TIA or stroke;deniesClaudication symptoms. admits to Compliance with medications; denies Problems with medications.   Past Medical History  Diagnosis Date  . Blood clotting disorder     DVT/PE x2 Coumadin x 2 years 2006  . Sleep apnea   . Pulmonary embolism   . Colon cancer   . Hypertension   . High cholesterol    Past Surgical History  Procedure Laterality Date  . Neck/back surgery  1995 and 2010  . Colon surgery  2007  . Appendectomy  1977   History   Social History  . Marital Status: Married    Spouse Name: N/A    Number of Children: 0  . Years of Education: N/A   Occupational History  .     Social History Main Topics  . Smoking status: Former Smoker -- 1.00 packs/day for 20 years    Types: Cigarettes    Quit date: 10/12/1992  . Smokeless tobacco: Not on file  . Alcohol Use: 0.0 oz/week     Comment: 8oz  . Drug Use: No  . Sexual Activity: Not on file   Other Topics Concern  . Not on file   Social History Narrative  . No narrative on file   Family History  Problem Relation Age of Onset  . Emphysema Paternal Grandfather   . Allergies Sister   . Allergies Mother   . Pulmonary embolism Paternal Grandmother    Current Outpatient Prescriptions on File Prior to Visit  Medication Sig Dispense Refill  . aspirin 81 MG tablet Take 81 mg by mouth daily.        Marland Kitchen atorvastatin (LIPITOR) 40 MG tablet TAKE 1 TABLET DAILY  90  tablet  0  . CIALIS 20 MG tablet TAKE AS DIRECTED  12 tablet  0  . amLODipine (NORVASC) 5 MG tablet Take 1 tablet (5 mg total) by mouth daily.  90 tablet  3   No current facility-administered medications on file prior to visit.   Allergies  Allergen Reactions  . Benadryl [Diphenhydramine Hcl]     Nerves "crawling" feelings   Immunization History  Administered Date(s) Administered  . Influenza Split 07/17/2011  . Tdap 08/09/2012   Prior to Admission medications   Medication Sig Start Date End Date Taking? Authorizing Provider  aspirin 81 MG tablet Take 81 mg by mouth daily.     Yes Historical Provider, MD  atorvastatin (LIPITOR) 10 MG tablet Take 20 mg by mouth Daily.  05/23/11  Yes Historical Provider, MD  atorvastatin (LIPITOR) 40 MG tablet TAKE 1 TABLET DAILY 06/20/13  Yes Ileana Ladd, MD  CIALIS 20 MG tablet TAKE AS DIRECTED 01/22/13  Yes Ileana Ladd, MD  amLODipine (NORVASC) 5 MG tablet Take 1 tablet (5 mg total) by mouth daily. 05/22/13   Ileana Ladd, MD     ROS: As above in the HPI. All other systems are stable or negative.  OBJECTIVE: APPEARANCE:  Patient in no acute distress.The patient  appeared well nourished and normally developed. Acyanotic. Waist: VITAL SIGNS:BP 129/84  Pulse 86  Temp(Src) 98.8 F (37.1 C) (Oral)  Ht 5\' 8"  (1.727 m)  Wt 182 lb 3.2 oz (82.645 kg)  BMI 27.71 kg/m2 WM   SKIN: warm and  Dry without overt rashes, tattoos and scars  HEAD and Neck: without JVD, Head and scalp: normal Eyes:No scleral icterus. Fundi normal, eye movements normal. Ears: Auricle normal, canal normal, Tympanic membranes normal, insufflation normal. Nose: normal Throat: normal Neck & thyroid: normal  CHEST & LUNGS: Chest wall: normal Lungs: Clear  CVS: Reveals the PMI to be normally located. Regular rhythm, First and Second Heart sounds are normal,  absence of murmurs, rubs or gallops. Peripheral vasculature: Radial pulses: normal Dorsal pedis pulses:  normal Posterior pulses: normal  ABDOMEN:  Appearance: normal Benign, no organomegaly, no masses, no Abdominal Aortic enlargement. No Guarding , no rebound. No Bruits. Bowel sounds: normal  RECTAL: N/A GU: N/A  EXTREMETIES: nonedematous.  MUSCULOSKELETAL:  Spine: normal Joints: intact  NEUROLOGIC: oriented to time,place and person; nonfocal. Strength is normal Sensory is normal Reflexes are normal Cranial Nerves are normal.   Results for orders placed in visit on 05/15/13  CMP14+EGFR      Result Value Range   Glucose 87  65 - 99 mg/dL   BUN 14  6 - 24 mg/dL   Creatinine, Ser 5.62 (*) 0.76 - 1.27 mg/dL   GFR calc non Af Amer 103  >59 mL/min/1.73   GFR calc Af Amer 119  >59 mL/min/1.73   BUN/Creatinine Ratio 19  9 - 20   Sodium 138  134 - 144 mmol/L   Potassium 4.6  3.5 - 5.2 mmol/L   Chloride 98  97 - 108 mmol/L   CO2 27  18 - 29 mmol/L   Calcium 9.8  8.7 - 10.2 mg/dL   Total Protein 7.9  6.0 - 8.5 g/dL   Albumin 4.9  3.5 - 5.5 g/dL   Globulin, Total 3.0  1.5 - 4.5 g/dL   Albumin/Globulin Ratio 1.6  1.1 - 2.5   Total Bilirubin 0.7  0.0 - 1.2 mg/dL   Alkaline Phosphatase 69  39 - 117 IU/L   AST 23  0 - 40 IU/L   ALT 26  0 - 44 IU/L  NMR, LIPOPROFILE      Result Value Range   LDL Particle Number 1811 (*) <1000 nmol/L   LDLC SERPL CALC-MCNC 110 (*) <100 mg/dL   HDL Cholesterol by NMR 48  >=40 mg/dL   Triglycerides by NMR 194 (*) <150 mg/dL   Cholesterol 130  <865 mg/dL   HDL Particle Number 78.4  >=69.6 umol/L   Small LDL Particle Number 765 (*) <=527 nmol/L   LDL Size 20.5 (*) >20.5 nm   LP-IR Score 68 (*) <=45    ASSESSMENT:  High cholesterol - Plan: CMP14+EGFR, NMR, lipoprofile  Hypertension - Plan: CMP14+EGFR  Allergic rhinitis - Plan: fluticasone (FLONASE) 50 MCG/ACT nasal spray, DISCONTINUED: fluticasone (FLONASE) 50 MCG/ACT nasal spray  Obstructive sleep apnea  Blood clotting disorder  Colon cancer  PLAN: Allergen avoidance.  Fluids  rest Continue medications. Orders Placed This Encounter  Procedures  . CMP14+EGFR  . NMR, lipoprofile   Meds ordered this encounter  Medications  . DISCONTD: fluticasone (FLONASE) 50 MCG/ACT nasal spray    Sig: Place 2 sprays into both nostrils daily.    Dispense:  16 g    Refill:  3  . fluticasone (FLONASE) 50 MCG/ACT  nasal spray    Sig: Place 2 sprays into both nostrils daily.    Dispense:  16 g    Refill:  3   Medications Discontinued During This Encounter  Medication Reason  . atorvastatin (LIPITOR) 10 MG tablet Error  . fluticasone (FLONASE) 50 MCG/ACT nasal spray Reorder   Return in about 4 months (around 02/02/2014) for Recheck medical problems.  Edson Deridder P. Modesto Charon, M.D.

## 2013-10-07 LAB — CMP14+EGFR
ALT: 23 IU/L (ref 0–44)
AST: 26 IU/L (ref 0–40)
Albumin/Globulin Ratio: 2.1 (ref 1.1–2.5)
Albumin: 5 g/dL (ref 3.5–5.5)
Alkaline Phosphatase: 65 IU/L (ref 39–117)
BUN/Creatinine Ratio: 14 (ref 9–20)
BUN: 13 mg/dL (ref 6–24)
CO2: 25 mmol/L (ref 18–29)
Calcium: 10.2 mg/dL (ref 8.7–10.2)
Chloride: 98 mmol/L (ref 97–108)
Creatinine, Ser: 0.94 mg/dL (ref 0.76–1.27)
GFR calc Af Amer: 104 mL/min/1.73
GFR calc non Af Amer: 90 mL/min/1.73
Globulin, Total: 2.4 g/dL (ref 1.5–4.5)
Glucose: 96 mg/dL (ref 65–99)
Potassium: 4.5 mmol/L (ref 3.5–5.2)
Sodium: 138 mmol/L (ref 134–144)
Total Bilirubin: 0.5 mg/dL (ref 0.0–1.2)
Total Protein: 7.4 g/dL (ref 6.0–8.5)

## 2013-10-07 LAB — NMR, LIPOPROFILE
Cholesterol: 200 mg/dL — ABNORMAL HIGH (ref <200)
HDL Cholesterol by NMR: 47 mg/dL (ref >=40)
HDL Particle Number: 38.4 umol/L (ref >=30.5)
LDL Particle Number: 1900 nmol/L — ABNORMAL HIGH (ref <1000)
LDL Size: 19.8 nm — ABNORMAL LOW (ref >20.5)
LDLC SERPL CALC-MCNC: 110 mg/dL — ABNORMAL HIGH (ref <100)
LP-IR Score: 79 — ABNORMAL HIGH (ref <=45)
Small LDL Particle Number: 1395 nmol/L — ABNORMAL HIGH (ref <=527)
Triglycerides by NMR: 217 mg/dL — ABNORMAL HIGH (ref <150)

## 2013-10-09 ENCOUNTER — Other Ambulatory Visit: Payer: Self-pay | Admitting: Family Medicine

## 2013-10-09 DIAGNOSIS — E785 Hyperlipidemia, unspecified: Secondary | ICD-10-CM

## 2013-10-09 MED ORDER — EZETIMIBE 10 MG PO TABS: 10.0000 mg | ORAL_TABLET | Freq: Every day | ORAL | Status: DC

## 2013-10-10 ENCOUNTER — Telehealth: Payer: Self-pay | Admitting: Family Medicine

## 2013-10-11 MED ORDER — ATORVASTATIN CALCIUM 40 MG PO TABS: 40.0000 mg | ORAL_TABLET | Freq: Every day | ORAL | Status: DC

## 2013-10-11 NOTE — Telephone Encounter (Signed)
done

## 2013-11-10 ENCOUNTER — Ambulatory Visit: Payer: 59

## 2013-11-10 DIAGNOSIS — Z23 Encounter for immunization: Secondary | ICD-10-CM

## 2014-01-08 ENCOUNTER — Ambulatory Visit (INDEPENDENT_AMBULATORY_CARE_PROVIDER_SITE_OTHER): Payer: 59 | Admitting: Family Medicine

## 2014-01-08 ENCOUNTER — Encounter: Payer: Self-pay | Admitting: Family Medicine

## 2014-01-08 VITALS — BP 116/74 | HR 67 | Temp 98.0°F | Ht 68.0 in | Wt 190.8 lb

## 2014-01-08 DIAGNOSIS — R899 Unspecified abnormal finding in specimens from other organs, systems and tissues: Secondary | ICD-10-CM | POA: Insufficient documentation

## 2014-01-08 DIAGNOSIS — C189 Malignant neoplasm of colon, unspecified: Secondary | ICD-10-CM

## 2014-01-08 DIAGNOSIS — E785 Hyperlipidemia, unspecified: Secondary | ICD-10-CM

## 2014-01-08 DIAGNOSIS — E7800 Pure hypercholesterolemia, unspecified: Secondary | ICD-10-CM

## 2014-01-08 DIAGNOSIS — I2699 Other pulmonary embolism without acute cor pulmonale: Secondary | ICD-10-CM

## 2014-01-08 DIAGNOSIS — R6889 Other general symptoms and signs: Secondary | ICD-10-CM

## 2014-01-08 DIAGNOSIS — G4733 Obstructive sleep apnea (adult) (pediatric): Secondary | ICD-10-CM

## 2014-01-08 DIAGNOSIS — I10 Essential (primary) hypertension: Secondary | ICD-10-CM | POA: Insufficient documentation

## 2014-01-08 DIAGNOSIS — E782 Mixed hyperlipidemia: Secondary | ICD-10-CM | POA: Insufficient documentation

## 2014-01-08 MED ORDER — EZETIMIBE 10 MG PO TABS: 10.0000 mg | ORAL_TABLET | Freq: Every day | ORAL | Status: DC

## 2014-01-08 MED ORDER — AMLODIPINE BESYLATE 5 MG PO TABS: 5.0000 mg | ORAL_TABLET | Freq: Every day | ORAL | Status: DC

## 2014-01-08 NOTE — Progress Notes (Signed)
Patient ID: Cody Hernandez, male   DOB: 04/23/1956, 58 y.o.   MRN: [id number] SUBJECTIVE: CC: Chief Complaint  Patient presents with  . Follow-up    complete form from [facility] . 4 month ck up and nees rx for amlodipine $RemoveBefore'5mg'[id number]$     . Medication Refill    refill zetia    HPI:  Patient is here for follow up of hyperlipidemia/HTN/: denies Headache;denies Chest Pain;denies weakness;denies Shortness of Breath and orthopnea;denies Visual changes;denies palpitations;denies cough;denies pedal edema;denies symptoms of TIA or stroke;deniesClaudication symptoms. admits to Compliance with medications; denies Problems with medications.  Doing well.Marland Kitchen Has been travelling a lot. Less travel now.   Past Medical History  Diagnosis Date  . Blood clotting disorder     DVT/PE x2 Coumadin x 2 years 2006  . Sleep apnea   . Pulmonary embolism   . Colon cancer   . Hypertension   . High cholesterol    Past Surgical History  Procedure Laterality Date  . Neck/back surgery  1995 and 2010  . Colon surgery  2007  . Appendectomy  1977   History   Social History  . Marital Status: Married    Spouse Name: N/A    Number of Children: 0  . Years of Education: N/A   Occupational History  .     Social History Main Topics  . Smoking status: Former Smoker -- 1.00 packs/day for 20 years    Types: Cigarettes    Quit date: 10/12/1992  . Smokeless tobacco: Not on file  . Alcohol Use: 0.0 oz/week     Comment: 8oz  . Drug Use: No  . Sexual Activity: Not on file   Other Topics Concern  . Not on file   Social History Narrative  . No narrative on file   Family History  Problem Relation Age of Onset  . Emphysema Paternal Grandfather   . Allergies Sister   . Allergies Mother   . Pulmonary embolism Paternal Grandmother    Current Outpatient Prescriptions on File Prior to Visit  Medication Sig Dispense Refill  . aspirin 81 MG tablet Take 81 mg by mouth daily.        Marland Kitchen atorvastatin (LIPITOR) 40 MG tablet Take 1  tablet (40 mg total) by mouth daily.  90 tablet  1  . CIALIS 20 MG tablet TAKE AS DIRECTED  12 tablet  0  . fluticasone (FLONASE) 50 MCG/ACT nasal spray Place 2 sprays into both nostrils daily.  16 g  3   No current facility-administered medications on file prior to visit.   Allergies  Allergen Reactions  . Benadryl [Diphenhydramine Hcl]     Nerves "crawling" feelings   Immunization History  Administered Date(s) Administered  . Influenza Split 07/17/2011  . Influenza,inj,Quad PF,36+ Mos 11/10/2013  . Tdap 08/09/2012   Prior to Admission medications   Medication Sig Start Date End Date Taking? Authorizing Provider  amLODipine (NORVASC) 5 MG tablet Take 1 tablet (5 mg total) by mouth daily. 05/22/13  Yes Vernie Shanks, MD  aspirin 81 MG tablet Take 81 mg by mouth daily.     Yes Historical Provider, MD  atorvastatin (LIPITOR) 40 MG tablet Take 1 tablet (40 mg total) by mouth daily. 10/11/13  Yes Vernie Shanks, MD  CIALIS 20 MG tablet TAKE AS DIRECTED 01/22/13  Yes Vernie Shanks, MD  ezetimibe (ZETIA) 10 MG tablet Take 1 tablet (10 mg total) by mouth daily. 10/09/13  Yes Vernie Shanks, MD  fluticasone (  FLONASE) 50 MCG/ACT nasal spray Place 2 sprays into both nostrils daily. 10/04/13  Yes Vernie Shanks, MD     ROS: As above in the HPI. All other systems are stable or negative.  OBJECTIVE: APPEARANCE:  Patient in no acute distress.The patient appeared well nourished and normally developed. Acyanotic. Waist: VITAL SIGNS:BP 116/74  Pulse 67  Temp(Src) 98 F (36.7 C) (Oral)  Ht $R'5\' 8"'vs$  (1.727 m)  Wt 190 lb 12.8 oz (86.546 kg)  BMI 29.02 kg/m2  WM  SKIN: warm and  Dry without overt rashes, tattoos and scars  HEAD and Neck: without JVD, Head and scalp: normal Eyes:No scleral icterus. Fundi normal, eye movements normal. Ears: Auricle normal, canal normal, Tympanic membranes normal, insufflation normal. Nose: normal Throat: normal Neck & thyroid: normal  CHEST &  LUNGS: Chest wall: normal Lungs: Clear  CVS: Reveals the PMI to be normally located. Regular rhythm, First and Second Heart sounds are normal,  absence of murmurs, rubs or gallops. Peripheral vasculature: Radial pulses: normal Dorsal pedis pulses: normal Posterior pulses: normal  ABDOMEN:  Appearance: normal Benign, no organomegaly, no masses, no Abdominal Aortic enlargement. No Guarding , no rebound. No Bruits. Bowel sounds: normal  RECTAL: N/A GU: N/A  EXTREMETIES: nonedematous.  MUSCULOSKELETAL:  Spine: normal Joints: intact  NEUROLOGIC: oriented to time,place and person; nonfocal. Strength is normal Sensory is normal Reflexes are normal Cranial Nerves are normal.  Results for orders placed in visit on 10/04/13  CMP14+EGFR      Result Value Ref Range   Glucose 96  65 - 99 mg/dL   BUN 13  6 - 24 mg/dL   Creatinine, Ser 0.94  0.76 - 1.27 mg/dL   GFR calc non Af Amer 90  >59 mL/min/1.73   GFR calc Af Amer 104  >59 mL/min/1.73   BUN/Creatinine Ratio 14  9 - 20   Sodium 138  134 - 144 mmol/L   Potassium 4.5  3.5 - 5.2 mmol/L   Chloride 98  97 - 108 mmol/L   CO2 25  18 - 29 mmol/L   Calcium 10.2  8.7 - 10.2 mg/dL   Total Protein 7.4  6.0 - 8.5 g/dL   Albumin 5.0  3.5 - 5.5 g/dL   Globulin, Total 2.4  1.5 - 4.5 g/dL   Albumin/Globulin Ratio 2.1  1.1 - 2.5   Total Bilirubin 0.5  0.0 - 1.2 mg/dL   Alkaline Phosphatase 65  39 - 117 IU/L   AST 26  0 - 40 IU/L   ALT 23  0 - 44 IU/L  NMR, LIPOPROFILE      Result Value Ref Range   LDL Particle Number 1900 (*) <1000 nmol/L   LDLC SERPL CALC-MCNC 110 (*) <100 mg/dL   HDL Cholesterol by NMR 47  >=40 mg/dL   Triglycerides by NMR 217 (*) <150 mg/dL   Cholesterol 200 (*) <200 mg/dL   HDL Particle Number 38.4  >=30.5 umol/L   Small LDL Particle Number [id number] (*) <=527 nmol/L   LDL Size 19.8 (*) >20.5 nm   LP-IR Score 79 (*) <=45    ASSESSMENT:  HTN (hypertension) - Plan: amLODipine (NORVASC) 5 MG tablet  HLD  (hyperlipidemia) - Plan: ezetimibe (ZETIA) 10 MG tablet, CMP14+EGFR, NMR, lipoprofile  Obstructive sleep apnea  Colon cancer - Plan: CEA  Abnormal laboratory test - Plan: PSA, total and free  Hypertension  High cholesterol  Pulmonary embolism - This was provoked from surgery for colon cancer and second event from  chemotherapy.  PLAN:  Handout on hypertension. Diet and exercise. Keep follow up with oncology and GI etc. Orders Placed This Encounter  Procedures  . PSA, total and free  . CEA  . CMP14+EGFR  . NMR, lipoprofile   Meds ordered this encounter  Medications  . amLODipine (NORVASC) 5 MG tablet    Sig: Take 1 tablet (5 mg total) by mouth daily.    Dispense:  90 tablet    Refill:  3  . ezetimibe (ZETIA) 10 MG tablet    Sig: Take 1 tablet (10 mg total) by mouth daily.    Dispense:  90 tablet    Refill:  3   Medications Discontinued During This Encounter  Medication Reason  . amLODipine (NORVASC) 5 MG tablet Reorder  . ezetimibe (ZETIA) 10 MG tablet Reorder   Return in about 3 months (around 04/10/2014) for Recheck medical problems.  Vaidehi Braddy P. Jacelyn Grip, M.D.

## 2014-01-08 NOTE — Patient Instructions (Signed)

## 2014-01-08 NOTE — Assessment & Plan Note (Signed)
Pulmonary embolism occurred immediately post op colectomy and during chemotherapy.

## 2014-01-09 LAB — NMR, LIPOPROFILE
Cholesterol: 182 mg/dL (ref <200)
HDL Cholesterol by NMR: 48 mg/dL (ref >=40)
HDL Particle Number: 36.7 umol/L (ref >=30.5)
LDL Particle Number: 1479 nmol/L — ABNORMAL HIGH (ref <1000)
LDL Size: 20.4 nm — ABNORMAL LOW (ref >20.5)
LDLC SERPL CALC-MCNC: 97 mg/dL (ref <100)
LP-IR Score: 85 — ABNORMAL HIGH (ref <=45)
Small LDL Particle Number: 768 nmol/L — ABNORMAL HIGH (ref <=527)
Triglycerides by NMR: 183 mg/dL — ABNORMAL HIGH (ref <150)

## 2014-01-09 LAB — CMP14+EGFR
ALT: 64 IU/L — ABNORMAL HIGH (ref 0–44)
AST: 38 IU/L (ref 0–40)
Albumin/Globulin Ratio: 1.9 (ref 1.1–2.5)
Albumin: 4.9 g/dL (ref 3.5–5.5)
Alkaline Phosphatase: 73 IU/L (ref 39–117)
BUN/Creatinine Ratio: 17 (ref 9–20)
BUN: 14 mg/dL (ref 6–24)
CO2: 23 mmol/L (ref 18–29)
Calcium: 9.5 mg/dL (ref 8.7–10.2)
Chloride: 98 mmol/L (ref 97–108)
Creatinine, Ser: 0.81 mg/dL (ref 0.76–1.27)
GFR calc Af Amer: 114 mL/min/{1.73_m2} (ref >59)
GFR calc non Af Amer: 99 mL/min/{1.73_m2} (ref >59)
Globulin, Total: 2.6 g/dL (ref 1.5–4.5)
Glucose: 86 mg/dL (ref 65–99)
Potassium: 4.6 mmol/L (ref 3.5–5.2)
Sodium: 139 mmol/L (ref 134–144)
Total Bilirubin: 0.6 mg/dL (ref 0.0–1.2)
Total Protein: 7.5 g/dL (ref 6.0–8.5)

## 2014-01-09 LAB — PSA, TOTAL AND FREE
PSA, Free Pct: 14.2 %
PSA, Free: 0.17 ng/mL
PSA: 1.2 ng/mL (ref 0.0–4.0)

## 2014-01-09 LAB — CEA: CEA: 0.8 ng/mL (ref 0.0–4.7)

## 2014-01-10 ENCOUNTER — Other Ambulatory Visit: Payer: Self-pay | Admitting: Family Medicine

## 2014-01-10 DIAGNOSIS — R899 Unspecified abnormal finding in specimens from other organs, systems and tissues: Secondary | ICD-10-CM

## 2014-01-10 NOTE — Progress Notes (Signed)
Quick Note:  Call Patient Labs that are abnormal: One liver enzyme is elevated. It may be from his medications. And if he had any alcohol recently would cause it to be elevated The cholesterol and triglycerides are not quite at goal, but I would not adjust his medications due to the liver enzyme elevation The rest are at goal  Recommendations: Recheck the liver enzyme in 4 weeks. Avoid all alcohol and any tylenol and NSAIDs. Test ordered in EPIC.   ______

## 2014-01-26 ENCOUNTER — Other Ambulatory Visit (INDEPENDENT_AMBULATORY_CARE_PROVIDER_SITE_OTHER): Payer: 59

## 2014-01-26 DIAGNOSIS — R6889 Other general symptoms and signs: Secondary | ICD-10-CM

## 2014-01-26 DIAGNOSIS — R899 Unspecified abnormal finding in specimens from other organs, systems and tissues: Secondary | ICD-10-CM

## 2014-01-26 NOTE — Progress Notes (Signed)
Patient came in for labs only.

## 2014-01-27 LAB — HEPATIC FUNCTION PANEL
ALT: 39 IU/L (ref 0–44)
AST: 35 IU/L (ref 0–40)
Albumin: 4.7 g/dL (ref 3.5–5.5)
Alkaline Phosphatase: 104 IU/L (ref 39–117)
Bilirubin, Direct: 0.19 mg/dL (ref 0.00–0.40)
Total Bilirubin: 0.7 mg/dL (ref 0.0–1.2)
Total Protein: 7.1 g/dL (ref 6.0–8.5)

## 2014-01-27 LAB — HEPATITIS PANEL, ACUTE
Hep A IgM: NEGATIVE
Hep B C IgM: NEGATIVE
Hep C Virus Ab: <0.1 s/co ratio (ref 0.0–0.9)
Hepatitis B Surface Ag: NEGATIVE

## 2014-01-28 NOTE — Progress Notes (Signed)
Quick Note:  Call patient. Labs normal. No change in plan. ______ 

## 2014-02-02 ENCOUNTER — Ambulatory Visit: Payer: 59 | Admitting: Family Medicine

## 2014-04-30 ENCOUNTER — Ambulatory Visit (INDEPENDENT_AMBULATORY_CARE_PROVIDER_SITE_OTHER): Payer: 59 | Admitting: Family

## 2014-04-30 ENCOUNTER — Encounter: Payer: Self-pay | Admitting: Family

## 2014-04-30 VITALS — BP 119/80 | HR 66 | Temp 98.7°F | Ht 68.0 in | Wt 191.6 lb

## 2014-04-30 DIAGNOSIS — E785 Hyperlipidemia, unspecified: Secondary | ICD-10-CM

## 2014-04-30 DIAGNOSIS — Z1321 Encounter for screening for nutritional disorder: Secondary | ICD-10-CM

## 2014-04-30 DIAGNOSIS — N529 Male erectile dysfunction, unspecified: Secondary | ICD-10-CM

## 2014-04-30 DIAGNOSIS — I10 Essential (primary) hypertension: Secondary | ICD-10-CM

## 2014-04-30 MED ORDER — SILDENAFIL CITRATE 50 MG PO TABS: 50.0000 mg | ORAL_TABLET | Freq: Every day | ORAL | Status: DC | PRN

## 2014-04-30 MED ORDER — AMLODIPINE BESYLATE 5 MG PO TABS: 5.0000 mg | ORAL_TABLET | Freq: Every day | ORAL | Status: DC

## 2014-04-30 MED ORDER — ATORVASTATIN CALCIUM 40 MG PO TABS: 40.0000 mg | ORAL_TABLET | Freq: Every day | ORAL | Status: DC

## 2014-04-30 NOTE — Progress Notes (Signed)
Subjective:    Patient ID: Cody Hernandez, male    DOB: 16-Jan-1956, 58 y.o.   MRN: [id number]  Hypertension This is a chronic problem. The current episode started more than 1 year ago. The problem has been resolved since onset. The problem is controlled. Pertinent negatives include no anxiety, headaches, palpitations, peripheral edema or shortness of breath. Risk factors for coronary artery disease include dyslipidemia, male gender and family history. Past treatments include calcium channel blockers. The current treatment provides significant improvement. There is no history of kidney disease, CAD/MI, heart failure or a thyroid problem. Identifiable causes of hypertension include sleep apnea.  Hyperlipidemia This is a chronic problem. The current episode started more than 1 year ago. The problem is uncontrolled. Recent lipid tests were reviewed and are high. He has no history of diabetes or hypothyroidism. Pertinent negatives include no focal sensory loss, leg pain, myalgias or shortness of breath. Current antihyperlipidemic treatment includes statins. The current treatment provides moderate improvement of lipids. Risk factors for coronary artery disease include dyslipidemia, family history, hypertension and male sex.   *Pt uses a Cpap machine every night for sleep apnea. Pt states he is needing a new machine. Pt will have [employer] [facility] fax over necessary paperwork for [place] to fill out. Pt states he can not sleep without machine. Pt states his last sleep study was over 4 years ago.   Review of Systems  Constitutional: Negative.   HENT: Negative.   Respiratory: Negative.  Negative for shortness of breath.   Cardiovascular: Negative.  Negative for palpitations.  Gastrointestinal: Negative.   Endocrine: Negative.   Genitourinary: Negative.   Musculoskeletal: Negative.  Negative for myalgias.  Neurological: Negative.  Negative for headaches.  Hematological: Negative.     Psychiatric/Behavioral: Negative.   All other systems reviewed and are negative.      Objective:   Physical Exam  Vitals reviewed. Constitutional: He is oriented to person, place, and time. He appears well-developed and well-nourished. No distress.  HENT:  Head: Normocephalic.  Right Ear: External ear normal.  Left Ear: External ear normal.  Mouth/Throat: Oropharynx is clear and moist.  Eyes: Pupils are equal, round, and reactive to light. Right eye exhibits no discharge. Left eye exhibits no discharge.  Neck: Normal range of motion. Neck supple. No thyromegaly present.  Cardiovascular: Normal rate, regular rhythm, normal heart sounds and intact distal pulses.   No murmur heard. Pulmonary/Chest: Effort normal and breath sounds normal. No respiratory distress. He has no wheezes.  Abdominal: Soft. Bowel sounds are normal. He exhibits no distension. There is no tenderness.  Musculoskeletal: Normal range of motion. He exhibits no edema and no tenderness.  Neurological: He is alert and oriented to person, place, and time. He has normal reflexes. No cranial nerve deficit.  Skin: Skin is warm and dry. No rash noted. No erythema.  Psychiatric: He has a normal mood and affect. His behavior is normal. Judgment and thought content normal.    BP 119/80  Pulse 66  Temp(Src) 98.7 F (37.1 C) (Oral)  Ht $R'5\' 8"'ZT$  (1.727 m)  Wt 191 lb 9.6 oz (86.909 kg)  BMI 29.14 kg/m2       Assessment & Plan:  1. Essential hypertension - CMP14+EGFR - amLODipine (NORVASC) 5 MG tablet; Take 1 tablet (5 mg total) by mouth daily.  Dispense: 90 tablet; Refill: 3  2. HLD (hyperlipidemia) - Lipid panel - atorvastatin (LIPITOR) 40 MG tablet; Take 1 tablet (40 mg total) by mouth daily.  Dispense: 90 tablet; Refill: 3  3. Encounter for vitamin deficiency screening - Vit D  25 hydroxy (rtn osteoporosis monitoring)  4. Erectile dysfunction, unspecified erectile dysfunction type - sildenafil (VIAGRA) 50 MG  tablet; Take 1 tablet (50 mg total) by mouth daily as needed for erectile dysfunction.  Dispense: 30 tablet; Refill: 6   Continue all meds Labs pending Health Maintenance reviewed Diet and exercise encouraged RTO 3 months  Evelina Dun, FNP

## 2014-04-30 NOTE — Patient Instructions (Signed)

## 2014-05-01 ENCOUNTER — Other Ambulatory Visit: Payer: Self-pay | Admitting: Family

## 2014-05-01 ENCOUNTER — Encounter: Payer: Self-pay | Admitting: Family

## 2014-05-01 ENCOUNTER — Telehealth: Payer: Self-pay | Admitting: Family Medicine

## 2014-05-01 LAB — CMP14+EGFR
ALBUMIN: 4.7 g/dL (ref 3.5–5.5)
ALK PHOS: 79 IU/L (ref 39–117)
ALT: 25 IU/L (ref 0–44)
AST: 25 IU/L (ref 0–40)
Albumin/Globulin Ratio: 2 (ref 1.1–2.5)
BILIRUBIN TOTAL: 0.5 mg/dL (ref 0.0–1.2)
BUN / CREAT RATIO: 14 (ref 9–20)
BUN: 12 mg/dL (ref 6–24)
CO2: 23 mmol/L (ref 18–29)
Calcium: 9.7 mg/dL (ref 8.7–10.2)
Chloride: 101 mmol/L (ref 97–108)
Creatinine, Ser: 0.86 mg/dL (ref 0.76–1.27)
GFR calc non Af Amer: 96 mL/min/{1.73_m2} (ref >59)
GFR, EST AFRICAN AMERICAN: 111 mL/min/{1.73_m2} (ref >59)
Globulin, Total: 2.3 g/dL (ref 1.5–4.5)
Glucose: 90 mg/dL (ref 65–99)
POTASSIUM: 4.4 mmol/L (ref 3.5–5.2)
Sodium: 140 mmol/L (ref 134–144)
Total Protein: 7 g/dL (ref 6.0–8.5)

## 2014-05-01 LAB — VITAMIN D 25 HYDROXY (VIT D DEFICIENCY, FRACTURES): Vit D, 25-Hydroxy: 37.3 ng/mL (ref 30.0–100.0)

## 2014-05-01 LAB — LIPID PANEL
Chol/HDL Ratio: 4.2 ratio units (ref 0.0–5.0)
Cholesterol, Total: 164 mg/dL (ref 100–199)
HDL: 39 mg/dL — ABNORMAL LOW (ref >39)
LDL Calculated: 84 mg/dL (ref 0–99)
Triglycerides: 207 mg/dL — ABNORMAL HIGH (ref 0–149)
VLDL Cholesterol Cal: 41 mg/dL — ABNORMAL HIGH (ref 5–40)

## 2014-05-01 NOTE — Telephone Encounter (Signed)
Message copied by Waverly Ferrari on Tue May 01, 2014  9:37 AM ------      Message from: [place], [place] A      Created: Tue May 01, 2014  8:51 AM       Kidney and liver function stable      Cholesterol levels WNL- Triglycerides high- Pt needs to be on low fat diet      Vit D levels low side of normal- Would benefit from Vit D OTC ------

## 2014-05-02 NOTE — Telephone Encounter (Signed)
Pt aware of lab results 

## 2014-08-06 ENCOUNTER — Ambulatory Visit (INDEPENDENT_AMBULATORY_CARE_PROVIDER_SITE_OTHER): Payer: 59 | Admitting: Family Medicine

## 2014-08-06 ENCOUNTER — Encounter: Payer: Self-pay | Admitting: Family Medicine

## 2014-08-06 VITALS — BP 110/66 | HR 71 | Temp 97.5°F | Ht 68.0 in | Wt 193.0 lb

## 2014-08-06 DIAGNOSIS — E785 Hyperlipidemia, unspecified: Secondary | ICD-10-CM

## 2014-08-06 DIAGNOSIS — Z23 Encounter for immunization: Secondary | ICD-10-CM

## 2014-08-06 DIAGNOSIS — R5383 Other fatigue: Secondary | ICD-10-CM

## 2014-08-06 DIAGNOSIS — I10 Essential (primary) hypertension: Secondary | ICD-10-CM

## 2014-08-06 LAB — POCT CBC
Granulocyte percent: 61.7 %G (ref 37–80)
HCT, POC: 41.7 % — AB (ref 43.5–53.7)
Hemoglobin: 14.1 g/dL (ref 14.1–18.1)
Lymph, poc: 2.2 (ref 0.6–3.4)
MCH, POC: 29.1 pg (ref 27–31.2)
MCHC: 33.8 g/dL (ref 31.8–35.4)
MCV: 85.9 fL (ref 80–97)
MPV: 8.2 fL (ref 0–99.8)
POC Granulocyte: 3.9 (ref 2–6.9)
POC LYMPH PERCENT: 34.3 %L (ref 10–50)
Platelet Count, POC: 267 10*3/uL (ref 142–424)
RBC: 4.9 M/uL (ref 4.69–6.13)
RDW, POC: 12.7 %
WBC: 6.3 10*3/uL (ref 4.6–10.2)

## 2014-08-06 NOTE — Progress Notes (Signed)
   Subjective:    Patient ID: Cody Hernandez, male    DOB: 08-13-1956, 58 y.o.   MRN: [id number]  HPI Patient is here for routine follow up.  He has hx of hypertension and hyperlipidemia.  Review of Systems  Constitutional: Negative for fever.  HENT: Negative for ear pain.   Eyes: Negative for discharge.  Respiratory: Negative for cough.   Cardiovascular: Negative for chest pain.  Gastrointestinal: Negative for abdominal distention.  Endocrine: Negative for polyuria.  Genitourinary: Negative for difficulty urinating.  Musculoskeletal: Negative for gait problem and neck pain.  Skin: Negative for color change and rash.  Neurological: Negative for speech difficulty and headaches.  Psychiatric/Behavioral: Negative for agitation.       Objective:    BP 110/66  Pulse 71  Temp(Src) 97.5 F (36.4 C) (Oral)  Ht $R'5\' 8"'cV$  (1.727 m)  Wt 193 lb (87.544 kg)  BMI 29.35 kg/m2 Physical Exam  Constitutional: He is oriented to person, place, and time. He appears well-developed and well-nourished.  HENT:  Head: Normocephalic and atraumatic.  Mouth/Throat: Oropharynx is clear and moist.  Eyes: Pupils are equal, round, and reactive to light.  Neck: Normal range of motion. Neck supple.  Cardiovascular: Normal rate and regular rhythm.   No murmur heard. Pulmonary/Chest: Effort normal and breath sounds normal.  Abdominal: Soft. Bowel sounds are normal. There is no tenderness.  Neurological: He is alert and oriented to person, place, and time.  Skin: Skin is warm and dry.  Psychiatric: He has a normal mood and affect.          Assessment & Plan:     ICD-9-CM ICD-10-CM   1. Essential hypertension, benign 401.1 I10 CMP14+EGFR     Lipid panel  2. Hyperlipemia 272.4 E78.5 CMP14+EGFR     Lipid panel  3. Other fatigue 780.79 R53.83 POCT CBC     Thyroid Panel With TSH     Return if symptoms worsen or fail to improve.  Lysbeth Penner FNP

## 2014-08-07 LAB — CMP14+EGFR
ALT: 34 IU/L (ref 0–44)
AST: 28 IU/L (ref 0–40)
Albumin/Globulin Ratio: 2.1 (ref 1.1–2.5)
Albumin: 4.7 g/dL (ref 3.5–5.5)
Alkaline Phosphatase: 75 IU/L (ref 39–117)
BUN/Creatinine Ratio: 15 (ref 9–20)
BUN: 13 mg/dL (ref 6–24)
CO2: 24 mmol/L (ref 18–29)
Calcium: 9.6 mg/dL (ref 8.7–10.2)
Chloride: 102 mmol/L (ref 97–108)
Creatinine, Ser: 0.89 mg/dL (ref 0.76–1.27)
GFR calc Af Amer: 110 mL/min/{1.73_m2} (ref >59)
GFR calc non Af Amer: 95 mL/min/{1.73_m2} (ref >59)
Globulin, Total: 2.2 g/dL (ref 1.5–4.5)
Glucose: 90 mg/dL (ref 65–99)
Potassium: 4.4 mmol/L (ref 3.5–5.2)
Sodium: 141 mmol/L (ref 134–144)
Total Bilirubin: 0.6 mg/dL (ref 0.0–1.2)
Total Protein: 6.9 g/dL (ref 6.0–8.5)

## 2014-08-07 LAB — LIPID PANEL
Chol/HDL Ratio: 4.2 ratio units (ref 0.0–5.0)
Cholesterol, Total: 164 mg/dL (ref 100–199)
HDL: 39 mg/dL — ABNORMAL LOW (ref >39)
LDL Calculated: 104 mg/dL — ABNORMAL HIGH (ref 0–99)
Triglycerides: 107 mg/dL (ref 0–149)
VLDL Cholesterol Cal: 21 mg/dL (ref 5–40)

## 2014-08-07 LAB — THYROID PANEL WITH TSH
Free Thyroxine Index: 2.2 (ref 1.2–4.9)
T3 Uptake Ratio: 32 % (ref 24–39)
T4, Total: 7 ug/dL (ref 4.5–12.0)
TSH: 0.889 u[IU]/mL (ref 0.450–4.500)

## 2014-08-13 ENCOUNTER — Telehealth: Payer: Self-pay | Admitting: *Deleted

## 2014-08-13 NOTE — Telephone Encounter (Signed)
Message,labs look good.

## 2014-11-01 ENCOUNTER — Telehealth: Payer: Self-pay | Admitting: Family

## 2014-11-01 DIAGNOSIS — E785 Hyperlipidemia, unspecified: Secondary | ICD-10-CM

## 2014-11-01 DIAGNOSIS — Z1321 Encounter for screening for nutritional disorder: Secondary | ICD-10-CM

## 2014-11-01 DIAGNOSIS — I10 Essential (primary) hypertension: Secondary | ICD-10-CM

## 2014-11-01 NOTE — Telephone Encounter (Signed)
Labs ordered. Pt needs to make sure he makes an appointment to be seen. Labs needs to be fasting.

## 2014-11-01 NOTE — Telephone Encounter (Signed)
Left detailed msg regarding provider feedback pt to CB with any further questions or concerns.

## 2014-11-12 ENCOUNTER — Ambulatory Visit: Payer: 59 | Admitting: Family

## 2014-12-31 ENCOUNTER — Encounter: Payer: Self-pay | Admitting: Family

## 2014-12-31 ENCOUNTER — Ambulatory Visit (INDEPENDENT_AMBULATORY_CARE_PROVIDER_SITE_OTHER): Payer: 59 | Admitting: Family

## 2014-12-31 VITALS — BP 120/74 | HR 66 | Temp 97.6°F | Ht 68.0 in | Wt 192.0 lb

## 2014-12-31 DIAGNOSIS — E785 Hyperlipidemia, unspecified: Secondary | ICD-10-CM | POA: Diagnosis not present

## 2014-12-31 DIAGNOSIS — E049 Nontoxic goiter, unspecified: Secondary | ICD-10-CM | POA: Diagnosis not present

## 2014-12-31 DIAGNOSIS — Z1321 Encounter for screening for nutritional disorder: Secondary | ICD-10-CM | POA: Diagnosis not present

## 2014-12-31 DIAGNOSIS — G4733 Obstructive sleep apnea (adult) (pediatric): Secondary | ICD-10-CM

## 2014-12-31 DIAGNOSIS — J309 Allergic rhinitis, unspecified: Secondary | ICD-10-CM

## 2014-12-31 DIAGNOSIS — I10 Essential (primary) hypertension: Secondary | ICD-10-CM | POA: Diagnosis not present

## 2014-12-31 NOTE — Progress Notes (Signed)
   Subjective:    Patient ID: Cody Hernandez, male    DOB: 07/17/1956, 59 y.o.   MRN: [id number]  Hyperlipidemia This is a chronic problem. The current episode started more than 1 year ago. The problem is uncontrolled. Recent lipid tests were reviewed and are high. He has no history of diabetes or hypothyroidism. Factors aggravating his hyperlipidemia include fatty foods. Pertinent negatives include no focal sensory loss, leg pain, myalgias or shortness of breath. Current antihyperlipidemic treatment includes statins. The current treatment provides moderate improvement of lipids. Risk factors for coronary artery disease include dyslipidemia, family history, hypertension, male sex and a sedentary lifestyle.  Hypertension This is a chronic problem. The current episode started more than 1 year ago. The problem has been resolved since onset. The problem is controlled. Pertinent negatives include no anxiety, headaches, palpitations, peripheral edema or shortness of breath. Risk factors for coronary artery disease include dyslipidemia, male gender and family history. Past treatments include calcium channel blockers. The current treatment provides significant improvement. There is no history of kidney disease, CAD/MI, heart failure or a thyroid problem. Identifiable causes of hypertension include sleep apnea.      Review of Systems  Constitutional: Negative.   HENT: Negative.   Respiratory: Negative.  Negative for shortness of breath.   Cardiovascular: Negative.  Negative for palpitations.  Gastrointestinal: Negative.   Endocrine: Negative.   Genitourinary: Negative.   Musculoskeletal: Negative.  Negative for myalgias.  Neurological: Negative.  Negative for headaches.  Hematological: Negative.   Psychiatric/Behavioral: Negative.   All other systems reviewed and are negative.      Objective:   Physical Exam  Constitutional: He is oriented to person, place, and time. He appears well-developed and  well-nourished. No distress.  HENT:  Head: Normocephalic.  Right Ear: External ear normal.  Left Ear: External ear normal.  Nose: Nose normal.  Mouth/Throat: Oropharynx is clear and moist.  Eyes: Pupils are equal, round, and reactive to light. Right eye exhibits no discharge. Left eye exhibits no discharge.  Neck: Normal range of motion. Neck supple. No thyromegaly present.  Goiter present   Cardiovascular: Normal rate, regular rhythm, normal heart sounds and intact distal pulses.   No murmur heard. Pulmonary/Chest: Effort normal and breath sounds normal. No respiratory distress. He has no wheezes.  Abdominal: Soft. Bowel sounds are normal. He exhibits no distension. There is no tenderness.  Musculoskeletal: Normal range of motion. He exhibits no edema or tenderness.  Neurological: He is alert and oriented to person, place, and time. He has normal reflexes. No cranial nerve deficit.  Skin: Skin is warm and dry. No rash noted. No erythema.  Psychiatric: He has a normal mood and affect. His behavior is normal. Judgment and thought content normal.  Vitals reviewed.     BP 120/74 mmHg  Pulse 66  Temp(Src) 97.6 F (36.4 C) (Oral)  Ht 5' 8" (1.727 m)  Wt 192 lb (87.091 kg)  BMI 29.20 kg/m2     Assessment & Plan:  1. Essential hypertension - CMP14+EGFR  2. Allergic rhinitis, unspecified allergic rhinitis type - CMP14+EGFR  3. Obstructive sleep apnea - CMP14+EGFR  4. HLD (hyperlipidemia) - CMP14+EGFR - Lipid panel  5. Encounter for vitamin deficiency screening - Vit D  25 hydroxy (rtn osteoporosis monitoring)  6. Goiter - Thyroid Panel With TSH   Continue all meds Labs pending Health Maintenance reviewed Diet and exercise encouraged RTO 3 months  Christy Hawks, FNP   

## 2014-12-31 NOTE — Patient Instructions (Signed)

## 2015-01-01 ENCOUNTER — Other Ambulatory Visit: Payer: Self-pay | Admitting: Family

## 2015-01-01 DIAGNOSIS — E559 Vitamin D deficiency, unspecified: Secondary | ICD-10-CM | POA: Insufficient documentation

## 2015-01-01 LAB — THYROID PANEL WITH TSH
FREE THYROXINE INDEX: 2.2 (ref 1.2–4.9)
T3 UPTAKE RATIO: 32 % (ref 24–39)
T4, Total: 6.8 ug/dL (ref 4.5–12.0)
TSH: 1.21 u[IU]/mL (ref 0.450–4.500)

## 2015-01-01 LAB — CMP14+EGFR
ALBUMIN: 4.7 g/dL (ref 3.5–5.5)
ALT: 31 IU/L (ref 0–44)
AST: 23 IU/L (ref 0–40)
Albumin/Globulin Ratio: 2.1 (ref 1.1–2.5)
Alkaline Phosphatase: 75 IU/L (ref 39–117)
BUN/Creatinine Ratio: 19 (ref 9–20)
BUN: 16 mg/dL (ref 6–24)
Bilirubin Total: 0.6 mg/dL (ref 0.0–1.2)
CALCIUM: 9.8 mg/dL (ref 8.7–10.2)
CO2: 22 mmol/L (ref 18–29)
Chloride: 103 mmol/L (ref 97–108)
Creatinine, Ser: 0.85 mg/dL (ref 0.76–1.27)
GFR calc Af Amer: 111 mL/min/{1.73_m2} (ref >59)
GFR, EST NON AFRICAN AMERICAN: 96 mL/min/{1.73_m2} (ref >59)
Globulin, Total: 2.2 g/dL (ref 1.5–4.5)
Glucose: 87 mg/dL (ref 65–99)
POTASSIUM: 4.2 mmol/L (ref 3.5–5.2)
SODIUM: 143 mmol/L (ref 134–144)
TOTAL PROTEIN: 6.9 g/dL (ref 6.0–8.5)

## 2015-01-01 LAB — VITAMIN D 25 HYDROXY (VIT D DEFICIENCY, FRACTURES): Vit D, 25-Hydroxy: 19.9 ng/mL — ABNORMAL LOW (ref 30.0–100.0)

## 2015-01-01 LAB — LIPID PANEL
CHOL/HDL RATIO: 3.9 ratio (ref 0.0–5.0)
CHOLESTEROL TOTAL: 160 mg/dL (ref 100–199)
HDL: 41 mg/dL (ref >39)
LDL Calculated: 88 mg/dL (ref 0–99)
Triglycerides: 156 mg/dL — ABNORMAL HIGH (ref 0–149)
VLDL Cholesterol Cal: 31 mg/dL (ref 5–40)

## 2015-01-01 MED ORDER — VITAMIN D (ERGOCALCIFEROL) 1.25 MG (50000 UNIT) PO CAPS: 50000.0000 [IU] | ORAL_CAPSULE | ORAL | Status: DC

## 2015-04-08 ENCOUNTER — Encounter: Payer: Self-pay | Admitting: Family

## 2015-04-08 ENCOUNTER — Encounter (INDEPENDENT_AMBULATORY_CARE_PROVIDER_SITE_OTHER): Payer: Self-pay

## 2015-04-08 ENCOUNTER — Ambulatory Visit (INDEPENDENT_AMBULATORY_CARE_PROVIDER_SITE_OTHER): Payer: 59 | Admitting: Family

## 2015-04-08 VITALS — BP 121/77 | HR 67 | Temp 97.6°F | Ht 68.0 in | Wt 193.0 lb

## 2015-04-08 DIAGNOSIS — Z125 Encounter for screening for malignant neoplasm of prostate: Secondary | ICD-10-CM

## 2015-04-08 DIAGNOSIS — E559 Vitamin D deficiency, unspecified: Secondary | ICD-10-CM

## 2015-04-08 DIAGNOSIS — N529 Male erectile dysfunction, unspecified: Secondary | ICD-10-CM

## 2015-04-08 DIAGNOSIS — I10 Essential (primary) hypertension: Secondary | ICD-10-CM | POA: Diagnosis not present

## 2015-04-08 DIAGNOSIS — J309 Allergic rhinitis, unspecified: Secondary | ICD-10-CM | POA: Diagnosis not present

## 2015-04-08 DIAGNOSIS — E785 Hyperlipidemia, unspecified: Secondary | ICD-10-CM | POA: Diagnosis not present

## 2015-04-08 MED ORDER — SILDENAFIL CITRATE 50 MG PO TABS: 50.0000 mg | ORAL_TABLET | Freq: Every day | ORAL | Status: DC | PRN

## 2015-04-08 NOTE — Progress Notes (Signed)
Subjective:    Patient ID: Cody Hernandez, male    DOB: November 12, 1955, 59 y.o.   MRN: [id number]  Pt presents to the office today for chronic follow-up. Pt  Reports feeling "good" and has no new complaints today.  Hypertension This is a chronic problem. The current episode started more than 1 year ago. The problem has been resolved since onset. The problem is controlled. Pertinent negatives include no anxiety, headaches, palpitations, peripheral edema or shortness of breath. Risk factors for coronary artery disease include dyslipidemia, male gender and family history. Past treatments include calcium channel blockers. The current treatment provides significant improvement. There is no history of kidney disease, CAD/MI, heart failure or a thyroid problem. Identifiable causes of hypertension include sleep apnea.  Hyperlipidemia This is a chronic problem. The current episode started more than 1 year ago. The problem is uncontrolled. Recent lipid tests were reviewed and are high. He has no history of diabetes or hypothyroidism. Factors aggravating his hyperlipidemia include fatty foods. Pertinent negatives include no focal sensory loss, leg pain, myalgias or shortness of breath. Current antihyperlipidemic treatment includes statins. The current treatment provides moderate improvement of lipids. Risk factors for coronary artery disease include dyslipidemia, family history, hypertension, male sex and a sedentary lifestyle.      Review of Systems  Constitutional: Negative.   HENT: Negative.   Respiratory: Negative.  Negative for shortness of breath.   Cardiovascular: Negative.  Negative for palpitations.  Gastrointestinal: Negative.   Endocrine: Negative.   Genitourinary: Negative.   Musculoskeletal: Negative.  Negative for myalgias.  Neurological: Negative.  Negative for headaches.  Hematological: Negative.   Psychiatric/Behavioral: Negative.   All other systems reviewed and are negative.        Objective:   Physical Exam  Constitutional: He is oriented to person, place, and time. He appears well-developed and well-nourished. No distress.  HENT:  Head: Normocephalic.  Right Ear: External ear normal.  Left Ear: External ear normal.  Nose: Nose normal.  Mouth/Throat: Oropharynx is clear and moist.  Eyes: Pupils are equal, round, and reactive to light. Right eye exhibits no discharge. Left eye exhibits no discharge.  Neck: Normal range of motion. Neck supple. No thyromegaly present.  Cardiovascular: Normal rate, regular rhythm, normal heart sounds and intact distal pulses.   No murmur heard. Pulmonary/Chest: Effort normal and breath sounds normal. No respiratory distress. He has no wheezes.  Abdominal: Soft. Bowel sounds are normal. He exhibits no distension. There is no tenderness.  Musculoskeletal: Normal range of motion. He exhibits no edema or tenderness.  Neurological: He is alert and oriented to person, place, and time. He has normal reflexes. No cranial nerve deficit.  Skin: Skin is warm and dry. No rash noted. No erythema.  Psychiatric: He has a normal mood and affect. His behavior is normal. Judgment and thought content normal.  Vitals reviewed.     BP 121/77 mmHg  Pulse 67  Temp(Src) 97.6 F (36.4 C) (Oral)  Ht 5\' 8"  (1.727 m)  Wt 193 lb (87.544 kg)  BMI 29.35 kg/m2     Assessment & Plan:  1. Vitamin D deficiency - CMP14+EGFR - Vit D  25 hydroxy (rtn osteoporosis monitoring)  2. HLD (hyperlipidemia) - CMP14+EGFR - Lipid panel  3. Allergic rhinitis, unspecified allergic rhinitis type - CMP14+EGFR  4. Essential hypertension - CMP14+EGFR  5. Prostate cancer screening - CMP14+EGFR - PSA Total+%Free (Serial)  6. Erectile dysfunction, unspecified erectile dysfunction type - CMP14+EGFR - sildenafil (VIAGRA) 50 MG tablet; Take  1 tablet (50 mg total) by mouth daily as needed for erectile dysfunction.  Dispense: 30 tablet; Refill: 6   Continue all  meds Labs pending Health Maintenance reviewed Diet and exercise encouraged RTO 3 months  Evelina Dun, FNP

## 2015-04-08 NOTE — Patient Instructions (Signed)

## 2015-04-09 LAB — PSA TOTAL+% FREE (SERIAL)
PSA FREE PCT: 15.5 %
PSA FREE: 0.17 ng/mL
Prostate Specific Ag, Serum: 1.1 ng/mL (ref 0.0–4.0)

## 2015-04-09 LAB — CMP14+EGFR
ALK PHOS: 76 IU/L (ref 39–117)
ALT: 35 IU/L (ref 0–44)
AST: 26 IU/L (ref 0–40)
Albumin/Globulin Ratio: 2 (ref 1.1–2.5)
Albumin: 4.5 g/dL (ref 3.5–5.5)
BILIRUBIN TOTAL: 0.6 mg/dL (ref 0.0–1.2)
BUN / CREAT RATIO: 13 (ref 9–20)
BUN: 12 mg/dL (ref 6–24)
CHLORIDE: 102 mmol/L (ref 97–108)
CO2: 23 mmol/L (ref 18–29)
Calcium: 9.3 mg/dL (ref 8.7–10.2)
Creatinine, Ser: 0.9 mg/dL (ref 0.76–1.27)
GFR calc Af Amer: 108 mL/min/{1.73_m2} (ref >59)
GFR calc non Af Amer: 94 mL/min/{1.73_m2} (ref >59)
GLUCOSE: 98 mg/dL (ref 65–99)
Globulin, Total: 2.2 g/dL (ref 1.5–4.5)
POTASSIUM: 4.3 mmol/L (ref 3.5–5.2)
Sodium: 141 mmol/L (ref 134–144)
Total Protein: 6.7 g/dL (ref 6.0–8.5)

## 2015-04-09 LAB — LIPID PANEL
CHOL/HDL RATIO: 4.4 ratio (ref 0.0–5.0)
Cholesterol, Total: 171 mg/dL (ref 100–199)
HDL: 39 mg/dL — ABNORMAL LOW (ref >39)
LDL CALC: 98 mg/dL (ref 0–99)
Triglycerides: 168 mg/dL — ABNORMAL HIGH (ref 0–149)
VLDL Cholesterol Cal: 34 mg/dL (ref 5–40)

## 2015-04-09 LAB — VITAMIN D 25 HYDROXY (VIT D DEFICIENCY, FRACTURES): VIT D 25 HYDROXY: 30.8 ng/mL (ref 30.0–100.0)

## 2015-05-20 ENCOUNTER — Other Ambulatory Visit: Payer: Self-pay | Admitting: Family

## 2015-07-22 ENCOUNTER — Ambulatory Visit: Payer: 59 | Admitting: Family

## 2015-08-09 ENCOUNTER — Telehealth: Payer: Self-pay | Admitting: Family

## 2015-08-12 ENCOUNTER — Ambulatory Visit (INDEPENDENT_AMBULATORY_CARE_PROVIDER_SITE_OTHER): Payer: 59 | Admitting: Family

## 2015-08-12 ENCOUNTER — Encounter: Payer: Self-pay | Admitting: Family

## 2015-08-12 VITALS — BP 128/79 | HR 71 | Temp 98.0°F | Ht 68.0 in | Wt 184.4 lb

## 2015-08-12 DIAGNOSIS — E8881 Metabolic syndrome: Secondary | ICD-10-CM

## 2015-08-12 DIAGNOSIS — E559 Vitamin D deficiency, unspecified: Secondary | ICD-10-CM | POA: Diagnosis not present

## 2015-08-12 DIAGNOSIS — E785 Hyperlipidemia, unspecified: Secondary | ICD-10-CM

## 2015-08-12 DIAGNOSIS — J309 Allergic rhinitis, unspecified: Secondary | ICD-10-CM

## 2015-08-12 DIAGNOSIS — I10 Essential (primary) hypertension: Secondary | ICD-10-CM

## 2015-08-12 DIAGNOSIS — G4733 Obstructive sleep apnea (adult) (pediatric): Secondary | ICD-10-CM | POA: Diagnosis not present

## 2015-08-12 DIAGNOSIS — Z23 Encounter for immunization: Secondary | ICD-10-CM

## 2015-08-12 MED ORDER — FLUTICASONE PROPIONATE 50 MCG/ACT NA SUSP: 2.0000 | Freq: Every day | NASAL | Status: DC

## 2015-08-12 NOTE — Patient Instructions (Signed)

## 2015-08-12 NOTE — Progress Notes (Signed)
Subjective:    Patient ID: Cody Hernandez, male    DOB: [birth year], 59 y.o.   MRN: [id number]  Pt presents to the office today for chronic follow up.  Hypertension This is a chronic problem. The current episode started more than 1 year ago. The problem has been resolved since onset. The problem is controlled. Pertinent negatives include no anxiety, headaches, palpitations, peripheral edema or shortness of breath. Risk factors for coronary artery disease include dyslipidemia, male gender and family history. Past treatments include calcium channel blockers. The current treatment provides significant improvement. There is no history of kidney disease, CAD/MI, heart failure or a thyroid problem. Identifiable causes of hypertension include sleep apnea.  Hyperlipidemia This is a chronic problem. The current episode started more than 1 year ago. The problem is controlled. Recent lipid tests were reviewed and are normal. He has no history of diabetes or hypothyroidism. Factors aggravating his hyperlipidemia include fatty foods. Pertinent negatives include no focal sensory loss, leg pain, myalgias or shortness of breath. Current antihyperlipidemic treatment includes statins. The current treatment provides moderate improvement of lipids. Risk factors for coronary artery disease include dyslipidemia, family history, hypertension, male sex and a sedentary lifestyle.      Review of Systems  Constitutional: Negative.   HENT: Negative.   Respiratory: Negative.  Negative for shortness of breath.   Cardiovascular: Negative.  Negative for palpitations.  Gastrointestinal: Negative.   Endocrine: Negative.   Genitourinary: Negative.   Musculoskeletal: Negative.  Negative for myalgias.  Neurological: Negative.  Negative for headaches.  Hematological: Negative.   Psychiatric/Behavioral: Negative.   All other systems reviewed and are negative.      Objective:   Physical Exam  Constitutional: He is oriented to  person, place, and time. He appears well-developed and well-nourished. No distress.  HENT:  Head: Normocephalic.  Right Ear: External ear normal.  Left Ear: External ear normal.  Mouth/Throat: Oropharynx is clear and moist.  Nasal passage erythemas with mild swelling    Eyes: Pupils are equal, round, and reactive to light. Right eye exhibits no discharge. Left eye exhibits no discharge.  Neck: Normal range of motion. Neck supple. No thyromegaly present.  Cardiovascular: Normal rate, regular rhythm, normal heart sounds and intact distal pulses.   No murmur heard. Pulmonary/Chest: Effort normal and breath sounds normal. No respiratory distress. He has no wheezes.  Abdominal: Soft. Bowel sounds are normal. He exhibits no distension. There is no tenderness.  Musculoskeletal: Normal range of motion. He exhibits no edema or tenderness.  Neurological: He is alert and oriented to person, place, and time. He has normal reflexes. No cranial nerve deficit.  Skin: Skin is warm and dry. No rash noted. No erythema.  Psychiatric: He has a normal mood and affect. His behavior is normal. Judgment and thought content normal.  Vitals reviewed.     BP 128/79 mmHg  Pulse 71  Temp(Src) 98 F (36.7 C) (Oral)  Ht _0  (1.727 m)  Wt 184 lb 6.4 oz (83.643 kg)  BMI 28.04 kg/m2     Assessment & Plan:  1. Essential hypertension - CMP14+EGFR  2. Allergic rhinitis, unspecified allergic rhinitis type - CMP14+EGFR - fluticasone (FLONASE) 50 MCG/ACT nasal spray; Place 2 sprays into both nostrils daily.  Dispense: 16 g; Refill: 6  3. Obstructive sleep apnea - CMP14+EGFR  4. HLD (hyperlipidemia) - CMP14+EGFR - Lipid panel  5. Vitamin D deficiency - CMP14+EGFR - Vit D  25 hydroxy (rtn osteoporosis monitoring)  6. Metabolic syndrome -  CMP14+EGFR   Continue all meds Labs pending Health Maintenance reviewed- Flu and Prevnar given today Diet and exercise encouraged RTO 3 months  Evelina Dun,  FNP

## 2015-08-12 NOTE — Addendum Note (Signed)
Addended by: Evelina Dun A on: 08/12/2015 02:38 PM   Modules accepted: Orders

## 2015-08-12 NOTE — Addendum Note (Signed)
Addended by: Louann Sjogren A on: 08/12/2015 03:14 PM   Modules accepted: Orders

## 2015-08-12 NOTE — Progress Notes (Signed)
fluarix and prevnar 13 given pt tolerated well

## 2015-08-13 ENCOUNTER — Other Ambulatory Visit: Payer: Self-pay | Admitting: Family

## 2015-08-13 LAB — CMP14+EGFR
A/G RATIO: 1.8 (ref 1.1–2.5)
ALBUMIN: 4.6 g/dL (ref 3.5–5.5)
ALK PHOS: 72 IU/L (ref 39–117)
ALT: 29 IU/L (ref 0–44)
AST: 24 IU/L (ref 0–40)
BUN / CREAT RATIO: 16 (ref 9–20)
BUN: 12 mg/dL (ref 6–24)
Bilirubin Total: 0.6 mg/dL (ref 0.0–1.2)
CHLORIDE: 101 mmol/L (ref 97–106)
CO2: 25 mmol/L (ref 18–29)
Calcium: 9.4 mg/dL (ref 8.7–10.2)
Creatinine, Ser: 0.77 mg/dL (ref 0.76–1.27)
GFR calc Af Amer: 116 mL/min/{1.73_m2} (ref >59)
GFR calc non Af Amer: 100 mL/min/{1.73_m2} (ref >59)
GLOBULIN, TOTAL: 2.5 g/dL (ref 1.5–4.5)
Glucose: 96 mg/dL (ref 65–99)
POTASSIUM: 4.4 mmol/L (ref 3.5–5.2)
SODIUM: 142 mmol/L (ref 136–144)
Total Protein: 7.1 g/dL (ref 6.0–8.5)

## 2015-08-13 LAB — LIPID PANEL
CHOLESTEROL TOTAL: 133 mg/dL (ref 100–199)
Chol/HDL Ratio: 3.5 ratio units (ref 0.0–5.0)
HDL: 38 mg/dL — ABNORMAL LOW (ref >39)
LDL CALC: 77 mg/dL (ref 0–99)
Triglycerides: 92 mg/dL (ref 0–149)
VLDL Cholesterol Cal: 18 mg/dL (ref 5–40)

## 2015-08-13 LAB — VITAMIN D 25 HYDROXY (VIT D DEFICIENCY, FRACTURES): Vit D, 25-Hydroxy: 30.5 ng/mL (ref 30.0–100.0)

## 2015-08-14 NOTE — Progress Notes (Signed)
Patient aware. Will pick up otc's

## 2015-11-25 ENCOUNTER — Encounter: Payer: Self-pay | Admitting: Family Medicine

## 2015-11-25 ENCOUNTER — Ambulatory Visit (INDEPENDENT_AMBULATORY_CARE_PROVIDER_SITE_OTHER): Payer: 59 | Admitting: Family Medicine

## 2015-11-25 VITALS — BP 112/61 | HR 75 | Temp 97.1°F | Ht 68.0 in | Wt 191.2 lb

## 2015-11-25 DIAGNOSIS — I10 Essential (primary) hypertension: Secondary | ICD-10-CM

## 2015-11-25 DIAGNOSIS — E785 Hyperlipidemia, unspecified: Secondary | ICD-10-CM

## 2015-11-25 DIAGNOSIS — G4733 Obstructive sleep apnea (adult) (pediatric): Secondary | ICD-10-CM | POA: Diagnosis not present

## 2015-11-25 NOTE — Progress Notes (Signed)
   HPI  Patient presents today routine follow-up.  Hypertension No medication problem No headache, chest pain, dyspnea, palpitations, leg edema Not watching his diet No regular exercise, active at work.  Hyperlipidemia Not watching his diet, good Lipitor complaints.  Obstructive sleep apnea Doing very well with CPAP  PMH: Smoking status noted ROS: Per HPI  Objective: BP 112/61 mmHg  Pulse 75  Temp(Src) 97.1 F (36.2 C) (Oral)  Ht _0  (1.727 m)  Wt 191 lb 3.2 oz (86.728 kg)  BMI 29.08 kg/m2 Gen: NAD, alert, cooperative with exam HEENT: NCAT CV: RRR, good S1/S2, no murmur Resp: CTABL, no wheezes, non-labored Abd: SNTND, BS present, no guarding or organomegaly Ext: No edema, warm Neuro: Alert and oriented, No gross deficits  Assessment and plan:  # HTN Doing well, well controlled Continiue norvasc Labs  # HLD LAbs Not watching diet- wanting to improve  # OSA Labs, Continue CPAP Has elevated BP in AM occasionally, I think his control is good overall    Orders Placed This Encounter  Procedures  . CMP14+EGFR    Standing Status: Future     Number of Occurrences:      Standing Expiration Date: 11/24/2016  . Lipid panel    Standing Status: Future     Number of Occurrences:      Standing Expiration Date: 11/24/2016  . CBC    Standing Status: Future     Number of Occurrences:      Standing Expiration Date: 11/24/2016     Laroy Apple, MD [facility] 11/25/2015, 3:55 PM

## 2015-11-26 ENCOUNTER — Other Ambulatory Visit (INDEPENDENT_AMBULATORY_CARE_PROVIDER_SITE_OTHER): Payer: 59

## 2015-11-26 DIAGNOSIS — E785 Hyperlipidemia, unspecified: Secondary | ICD-10-CM

## 2015-11-26 DIAGNOSIS — I10 Essential (primary) hypertension: Secondary | ICD-10-CM

## 2015-11-27 LAB — CMP14+EGFR
ALT: 26 IU/L (ref 0–44)
AST: 20 IU/L (ref 0–40)
Albumin/Globulin Ratio: 1.9 (ref 1.1–2.5)
Albumin: 4.3 g/dL (ref 3.5–5.5)
Alkaline Phosphatase: 67 IU/L (ref 39–117)
BUN/Creatinine Ratio: 19 (ref 9–20)
BUN: 15 mg/dL (ref 6–24)
Bilirubin Total: 0.5 mg/dL (ref 0.0–1.2)
CALCIUM: 9.2 mg/dL (ref 8.7–10.2)
CO2: 23 mmol/L (ref 18–29)
CREATININE: 0.81 mg/dL (ref 0.76–1.27)
Chloride: 103 mmol/L (ref 96–106)
GFR calc Af Amer: 112 mL/min/{1.73_m2} (ref >59)
GFR, EST NON AFRICAN AMERICAN: 97 mL/min/{1.73_m2} (ref >59)
GLOBULIN, TOTAL: 2.3 g/dL (ref 1.5–4.5)
Glucose: 93 mg/dL (ref 65–99)
POTASSIUM: 4.3 mmol/L (ref 3.5–5.2)
SODIUM: 141 mmol/L (ref 134–144)
Total Protein: 6.6 g/dL (ref 6.0–8.5)

## 2015-11-27 LAB — LIPID PANEL
CHOL/HDL RATIO: 3.4 ratio (ref 0.0–5.0)
CHOLESTEROL TOTAL: 134 mg/dL (ref 100–199)
HDL: 39 mg/dL — AB (ref >39)
LDL CALC: 76 mg/dL (ref 0–99)
TRIGLYCERIDES: 95 mg/dL (ref 0–149)
VLDL Cholesterol Cal: 19 mg/dL (ref 5–40)

## 2015-11-27 LAB — CBC
HEMATOCRIT: 39.7 % (ref 37.5–51.0)
HEMOGLOBIN: 13.6 g/dL (ref 12.6–17.7)
MCH: 30 pg (ref 26.6–33.0)
MCHC: 34.3 g/dL (ref 31.5–35.7)
MCV: 87 fL (ref 79–97)
Platelets: 258 10*3/uL (ref 150–379)
RBC: 4.54 x10E6/uL (ref 4.14–5.80)
RDW: 13.4 % (ref 12.3–15.4)
WBC: 5.8 10*3/uL (ref 3.4–10.8)

## 2015-12-18 ENCOUNTER — Other Ambulatory Visit: Payer: Self-pay | Admitting: Family

## 2015-12-31 ENCOUNTER — Encounter: Payer: Self-pay | Admitting: Family Medicine

## 2015-12-31 ENCOUNTER — Ambulatory Visit (INDEPENDENT_AMBULATORY_CARE_PROVIDER_SITE_OTHER): Payer: 59 | Admitting: Family Medicine

## 2015-12-31 VITALS — BP 112/66 | HR 72 | Temp 97.4°F | Ht 68.0 in | Wt 187.8 lb

## 2015-12-31 DIAGNOSIS — M5431 Sciatica, right side: Secondary | ICD-10-CM | POA: Diagnosis not present

## 2015-12-31 MED ORDER — PREGABALIN 75 MG PO CAPS: 75.0000 mg | ORAL_CAPSULE | Freq: Two times a day (BID) | ORAL | Status: DC

## 2015-12-31 NOTE — Progress Notes (Signed)
   HPI  Patient presents today for right-sided low back pain.  Patient's menses have years and years of pain, however the last 1 month has had increased symptoms. He has had 2 previous back surgeries which helped minimally. He describes right-sided low back pain that radiates down his right foot. Years intermittent foot numbness but no weakness. He has pretty common right lower leg achy type pain associated with the back pain that happens whenever he is at work. Seems to be worse whenever he wears certain types of shoes.  Denies bowel or bladder dysfunction, saddle anesthesia, or leg weakness.   PMH: Smoking status noted ROS: Per HPI  Objective: BP 112/66 mmHg  Pulse 72  Temp(Src) 97.4 F (36.3 C) (Oral)  Ht 5\' 8"  (1.727 m)  Wt 187 lb 12.8 oz (85.186 kg)  BMI 28.56 kg/m2 Gen: NAD, alert, cooperative with exam HEENT: NCAT Ext: No edema, warm Neuro: Alert and oriented, strength 5/5 and sesnation intact in BL LE MSK: NO spinal or paraspinal muscle tenderness to aplaption  Assessment and plan:  # Sciatica, Chronic low back pain Trial of lyrica, insurance coverage looks good for this Encourage PT exercises, he doesn't think they  Help RTC in 2-3 months, sooner if worsening Consider MRI/referral s/p 2 back surgeries    Meds ordered this encounter  Medications  . pregabalin (LYRICA) 75 MG capsule    Sig: Take 1 capsule (75 mg total) by mouth 2 (two) times daily.    Dispense:  60 capsule    Refill:  [place], MD [facility] 12/31/2015, 8:55 AM

## 2015-12-31 NOTE — Patient Instructions (Addendum)
Great to see you!  Come back in 2-3 months to see how its going.   Our next steps would be considering an MRI (insurance requires 6 weeks of conservative treatment from today in most cases) and or referral  Give the back exercises a try, they can help long term symptoms

## 2016-02-25 ENCOUNTER — Other Ambulatory Visit: Payer: Self-pay | Admitting: *Deleted

## 2016-02-25 ENCOUNTER — Telehealth: Payer: Self-pay | Admitting: Family Medicine

## 2016-02-25 DIAGNOSIS — E785 Hyperlipidemia, unspecified: Secondary | ICD-10-CM

## 2016-02-25 DIAGNOSIS — C189 Malignant neoplasm of colon, unspecified: Secondary | ICD-10-CM

## 2016-02-25 DIAGNOSIS — E559 Vitamin D deficiency, unspecified: Secondary | ICD-10-CM

## 2016-02-25 DIAGNOSIS — I10 Essential (primary) hypertension: Secondary | ICD-10-CM

## 2016-02-25 NOTE — Telephone Encounter (Signed)
Patient called stating that he will not be able to come in on 05/22 for his follow up.  But will be here on 05/19 for labs.

## 2016-02-25 NOTE — Telephone Encounter (Signed)
Labs ordered.   Laroy Apple, MD [facility] 02/25/2016, 10:16 AM

## 2016-02-28 ENCOUNTER — Other Ambulatory Visit: Payer: 59

## 2016-02-28 ENCOUNTER — Telehealth: Payer: Self-pay | Admitting: Family Medicine

## 2016-02-28 DIAGNOSIS — I10 Essential (primary) hypertension: Secondary | ICD-10-CM

## 2016-02-28 DIAGNOSIS — E785 Hyperlipidemia, unspecified: Secondary | ICD-10-CM

## 2016-02-28 DIAGNOSIS — Z139 Encounter for screening, unspecified: Secondary | ICD-10-CM

## 2016-02-28 DIAGNOSIS — E559 Vitamin D deficiency, unspecified: Secondary | ICD-10-CM

## 2016-02-28 DIAGNOSIS — C189 Malignant neoplasm of colon, unspecified: Secondary | ICD-10-CM

## 2016-02-28 NOTE — Telephone Encounter (Signed)
Left detailed message stating the PSA had already been ordered and to call back with any further questions or concerns.

## 2016-02-29 LAB — CMP14+EGFR
ALBUMIN: 4.5 g/dL (ref 3.5–5.5)
ALT: 28 IU/L (ref 0–44)
AST: 24 IU/L (ref 0–40)
Albumin/Globulin Ratio: 1.7 (ref 1.2–2.2)
Alkaline Phosphatase: 77 IU/L (ref 39–117)
BUN / CREAT RATIO: 15 (ref 9–20)
BUN: 11 mg/dL (ref 6–24)
Bilirubin Total: 0.7 mg/dL (ref 0.0–1.2)
CALCIUM: 9.6 mg/dL (ref 8.7–10.2)
CO2: 24 mmol/L (ref 18–29)
CREATININE: 0.74 mg/dL — AB (ref 0.76–1.27)
Chloride: 101 mmol/L (ref 96–106)
GFR, EST AFRICAN AMERICAN: 117 mL/min/{1.73_m2} (ref >59)
GFR, EST NON AFRICAN AMERICAN: 101 mL/min/{1.73_m2} (ref >59)
GLUCOSE: 92 mg/dL (ref 65–99)
Globulin, Total: 2.6 g/dL (ref 1.5–4.5)
Potassium: 4.1 mmol/L (ref 3.5–5.2)
Sodium: 141 mmol/L (ref 134–144)
TOTAL PROTEIN: 7.1 g/dL (ref 6.0–8.5)

## 2016-02-29 LAB — CBC WITH DIFFERENTIAL/PLATELET
BASOS ABS: 0 10*3/uL (ref 0.0–0.2)
BASOS: 0 %
EOS (ABSOLUTE): 0.1 10*3/uL (ref 0.0–0.4)
EOS: 1 %
Hematocrit: 40.8 % (ref 37.5–51.0)
Hemoglobin: 14.1 g/dL (ref 12.6–17.7)
IMMATURE GRANS (ABS): 0 10*3/uL (ref 0.0–0.1)
Immature Granulocytes: 0 %
LYMPHS: 40 %
Lymphocytes Absolute: 2 10*3/uL (ref 0.7–3.1)
MCH: 29.9 pg (ref 26.6–33.0)
MCHC: 34.6 g/dL (ref 31.5–35.7)
MCV: 86 fL (ref 79–97)
MONOCYTES: 7 %
Monocytes Absolute: 0.4 10*3/uL (ref 0.1–0.9)
NEUTROS ABS: 2.6 10*3/uL (ref 1.4–7.0)
Neutrophils: 52 %
PLATELETS: 255 10*3/uL (ref 150–379)
RBC: 4.72 x10E6/uL (ref 4.14–5.80)
RDW: 13.8 % (ref 12.3–15.4)
WBC: 5.1 10*3/uL (ref 3.4–10.8)

## 2016-02-29 LAB — LIPID PANEL
CHOLESTEROL TOTAL: 142 mg/dL (ref 100–199)
Chol/HDL Ratio: 3.6 ratio units (ref 0.0–5.0)
HDL: 39 mg/dL — ABNORMAL LOW (ref >39)
LDL Calculated: 77 mg/dL (ref 0–99)
Triglycerides: 128 mg/dL (ref 0–149)
VLDL CHOLESTEROL CAL: 26 mg/dL (ref 5–40)

## 2016-02-29 LAB — VITAMIN D 25 HYDROXY (VIT D DEFICIENCY, FRACTURES): Vit D, 25-Hydroxy: 26.1 ng/mL — ABNORMAL LOW (ref 30.0–100.0)

## 2016-03-01 LAB — SPECIMEN STATUS REPORT

## 2016-03-01 LAB — PSA: PROSTATE SPECIFIC AG, SERUM: 1.1 ng/mL (ref 0.0–4.0)

## 2016-03-02 ENCOUNTER — Ambulatory Visit: Payer: 59 | Admitting: Family Medicine

## 2016-05-22 ENCOUNTER — Telehealth: Payer: Self-pay | Admitting: Family Medicine

## 2016-05-22 NOTE — Telephone Encounter (Signed)
appt scheduled Pt notified 

## 2016-06-30 ENCOUNTER — Ambulatory Visit (INDEPENDENT_AMBULATORY_CARE_PROVIDER_SITE_OTHER): Payer: 59 | Admitting: Family Medicine

## 2016-06-30 ENCOUNTER — Encounter: Payer: Self-pay | Admitting: Family Medicine

## 2016-06-30 VITALS — BP 123/76 | HR 69 | Temp 98.0°F | Ht 68.0 in | Wt 188.2 lb

## 2016-06-30 DIAGNOSIS — E559 Vitamin D deficiency, unspecified: Secondary | ICD-10-CM

## 2016-06-30 DIAGNOSIS — Z23 Encounter for immunization: Secondary | ICD-10-CM

## 2016-06-30 DIAGNOSIS — K5900 Constipation, unspecified: Secondary | ICD-10-CM | POA: Diagnosis not present

## 2016-06-30 DIAGNOSIS — I10 Essential (primary) hypertension: Secondary | ICD-10-CM | POA: Diagnosis not present

## 2016-06-30 DIAGNOSIS — E785 Hyperlipidemia, unspecified: Secondary | ICD-10-CM | POA: Diagnosis not present

## 2016-06-30 NOTE — Addendum Note (Signed)
Addended by: Timmothy Euler on: 06/30/2016 02:39 PM   Modules accepted: Orders

## 2016-06-30 NOTE — Progress Notes (Addendum)
   HPI  Patient presents today here for follow-up of chronic medical conditions  Hypertension Not checking blood pressure home Good medication compliance with amlodipine No chest pain, dyspnea, palpitations.  Hyperlipidemia On Lipitor, tolerating easily.  History of pulmonary embolism PE 2, one associated with cancer, the other associated with surgery. States that his hematologist to come off of Coumadin, he has maintained daily aspirin since that time.  Flu shot today  Discussed constipation, he feels this is due to surgery for colon cancer, he's been using Hardin Negus laxative occasionally, and at times almost daily.- Declines rectal exam  PMH: Smoking status noted ROS: Per HPI  Objective: BP 123/76   Pulse 69   Temp 98 F (36.7 C) (Oral)   Ht 5\' 8"  (1.727 m)   Wt 188 lb 3.2 oz (85.4 kg)   BMI 28.62 kg/m  Gen: NAD, alert, cooperative with exam HEENT: NCAT CV: RRR, good S1/S2, no murmur Resp: CTABL, no wheezes, non-labored Ext: No edema, warm Neuro: Alert and oriented, No gross deficits  Assessment and plan:  # Hypertension Well-controlled Continue amlodipine Follow-up 4 months  # Hyperlipidemia Previous lipid panel shows LDL 77, 4 months ago Annual labs planned Continue atorvastatin  # Constipation Discussed the utility of miralax and how to titrate, lkely a better long term treatemnt  # Healthcare maintenance Flu shot given today  Vitamin D deficiency-rechecking lab levels   Laroy Apple, MD [facility] 06/30/2016, 2:11 PM

## 2016-07-01 ENCOUNTER — Other Ambulatory Visit: Payer: Self-pay | Admitting: Family Medicine

## 2016-07-01 LAB — VITAMIN D 25 HYDROXY (VIT D DEFICIENCY, FRACTURES): Vit D, 25-Hydroxy: 26.2 ng/mL — ABNORMAL LOW (ref 30.0–100.0)

## 2016-07-02 ENCOUNTER — Other Ambulatory Visit: Payer: Self-pay | Admitting: Family Medicine

## 2016-07-02 MED ORDER — VITAMIN D (ERGOCALCIFEROL) 1.25 MG (50000 UNIT) PO CAPS: 50000.0000 [IU] | ORAL_CAPSULE | ORAL | 0 refills | Status: DC

## 2016-07-07 ENCOUNTER — Encounter: Payer: Self-pay | Admitting: Family Medicine

## 2016-07-07 ENCOUNTER — Ambulatory Visit (INDEPENDENT_AMBULATORY_CARE_PROVIDER_SITE_OTHER): Payer: 59 | Admitting: Family Medicine

## 2016-07-07 VITALS — BP 127/77 | HR 74 | Temp 97.8°F | Ht 68.0 in | Wt 184.8 lb

## 2016-07-07 DIAGNOSIS — N401 Enlarged prostate with lower urinary tract symptoms: Secondary | ICD-10-CM | POA: Insufficient documentation

## 2016-07-07 DIAGNOSIS — K640 First degree hemorrhoids: Secondary | ICD-10-CM

## 2016-07-07 DIAGNOSIS — N400 Enlarged prostate without lower urinary tract symptoms: Secondary | ICD-10-CM | POA: Diagnosis not present

## 2016-07-07 DIAGNOSIS — E559 Vitamin D deficiency, unspecified: Secondary | ICD-10-CM | POA: Diagnosis not present

## 2016-07-07 DIAGNOSIS — R3911 Hesitancy of micturition: Secondary | ICD-10-CM | POA: Insufficient documentation

## 2016-07-07 NOTE — Progress Notes (Signed)
   HPI  Patient presents today here with rectal itching in to discuss prostate issues.  Patient has a history of rectal cancer with rectum removed surgically, he's had annual colonoscopies by his colorectal surgeon for several years, he's been doing well. He has chronic leakage of stool intermittently, this is unchanged. He complains of itching and irritation at the rectum with a palpable area similar to a hemorrhoid has been bothering him off and on for a few months. He has very mild pain with straight straining to stool.  He denies any rectal bleeding.  He has several episodes of nocturia, incomplete emptying, and weak stream. He does not want to try medications, he states that he had an adverse effect Flomax when he was younger, however he cannot remember what it was.   PMH: Smoking status noted ROS: Per HPI  Objective: BP 127/77   Pulse 74   Temp 97.8 F (36.6 C) (Oral)   Ht 5\' 8"  (1.727 m)   Wt 184 lb 12.8 oz (83.8 kg)   BMI 28.10 kg/m  Gen: NAD, alert, cooperative with exam HEENT: NCAT CV: RRR, good S1/S2, no murmur Resp: CTABL, no wheezes, non-labored Ext: No edema, warm Neuro: Alert and oriented, No gross deficits  DRE: Flat flesh-colored flaccid piece of tissue consistent with resolving hemorrhoid at 12:00, no tenderness to palpation, no fissures Normal rectal tone.  Enlarged smooth nontender prostate  Assessment and plan:  # External hemorrhoid Most consistent with resolving external hemorrhoid Recommended Preparation H Return to clinic as needed, very low threshold for return to surgery with radius colorectal cancer  # BPH Offered Flomax, he may call back and ask for this prescription Also discussed Proscar. His previous PSA was 1.1 five months ago.  Vitamin D deficiency Reviewed dosing of 50,000 units once weekly for 12 weeks, followed by 2000 units daily  Laroy Apple, MD [facility] 07/07/2016, 9:52 AM

## 2016-09-07 ENCOUNTER — Encounter: Payer: Self-pay | Admitting: Family Medicine

## 2016-09-07 ENCOUNTER — Encounter (INDEPENDENT_AMBULATORY_CARE_PROVIDER_SITE_OTHER): Payer: Self-pay

## 2016-09-07 ENCOUNTER — Ambulatory Visit (INDEPENDENT_AMBULATORY_CARE_PROVIDER_SITE_OTHER): Payer: 59 | Admitting: Family Medicine

## 2016-09-07 VITALS — BP 120/78 | HR 60 | Temp 97.4°F | Ht 68.0 in | Wt 190.0 lb

## 2016-09-07 DIAGNOSIS — M542 Cervicalgia: Secondary | ICD-10-CM | POA: Diagnosis not present

## 2016-09-07 NOTE — Progress Notes (Signed)
   HPI  Patient presents today with left neck pain.  Patient explains his pain gone on for about 2 weeks, he feels it started after he turned his head having a popping sensation. The pain is very gradual and now has been a little bit more persistent. His describes left sided paraspinal cervical neck pain radiating to the left shoulder with no arm symptoms. He has more significant pain early in the morning that eases up throughout the day.  No arm or leg weakness. He has no discrete injury. He has no history of similar pain.  He has some improvement with Goody's powder. He wants to avoid additional medications if possible  PMH: Smoking status noted ROS: Per HPI  Objective: BP 120/78   Pulse 60   Temp 97.4 F (36.3 C) (Oral)   Ht 5\' 8"  (1.727 m)   Wt 190 lb (86.2 kg)   BMI 28.89 kg/m  Gen: NAD, alert, cooperative with exam HEENT: NCAT CV: RRR, good S1/S2, no murmur Resp: CTABL, no wheezes, non-labored Ext: No edema, warm Neuro: Alert and oriented, strength 5/5 and sensation intact in bilateral upper extremities Musculoskeletal Full range of motion in bilateral shoulders and neck No tenderness to palpation of paraspinal muscles or cervical spine Negative Hawkins and empty can test   Assessment and plan:  # Left neck pain Mild to moderate in severity, stable symptoms. No signs of radiculopathy Improving with over-the-counter medications Discussed and offered aggressive treatment including scheduled NSAIDs, short course of steroids, Flexeril. Would like to avoid more aggressive medical treatment for now Defer imaging for now She will try over-the-counter Aleve 1 pill once or twice a day for 2-3 weeks. If symptoms do not improve he will return for further medical management Discussed routine stretching exercises   Laroy Apple, MD [facility] 09/07/2016, 9:54 AM

## 2016-10-06 ENCOUNTER — Ambulatory Visit (INDEPENDENT_AMBULATORY_CARE_PROVIDER_SITE_OTHER): Payer: 59 | Admitting: Pediatrics

## 2016-10-06 ENCOUNTER — Encounter: Payer: Self-pay | Admitting: Pediatrics

## 2016-10-06 VITALS — BP 128/80 | HR 57 | Temp 97.1°F | Ht 68.0 in | Wt 190.4 lb

## 2016-10-06 DIAGNOSIS — M542 Cervicalgia: Secondary | ICD-10-CM | POA: Diagnosis not present

## 2016-10-06 NOTE — Progress Notes (Signed)
  Subjective:   Patient ID: Cody Hernandez, male    DOB: [birth year], 60 y.o.   MRN: [id number] CC: Neck Pain (left. Seen 11/27 and states he is only a little better.)  HPI: Cody Hernandez is a 60 y.o. male presenting for Neck Pain (left. Seen 11/27 and states he is only a little better.)  Was hurting on the back of R side  Has always had a muscle pop when he turns his head to the L Now hurting in R shoulder and back of his head Bothers him most days If he takes aleve it helps with pain and doesn't feel it anymore Takes one aleve in the morning 3 times a week Heating pad in the morning also helps  Says neck is better than what it was last visit Still comes back a little Doesn't like turnin ghis head L while driving  No numbness, tingling in his hands R foot and leg has been numb for years, present since surgery in   Normal appetite H/o colon ca, last colonoscopy 12/2015  Relevant past medical, surgical, family and social history reviewed. Allergies and medications reviewed and updated. History  Smoking Status  . Former Smoker  . Packs/day: 1.00  . Years: 20.00  . Types: Cigarettes  . Quit date: 10/12/1992  Smokeless Tobacco  . Never Used   ROS: Per HPI   Objective:    BP 128/80   Pulse (!) 57   Temp 97.1 F (36.2 C) (Oral)   Ht 5\' 8"  (1.727 m)   Wt 190 lb 6.4 oz (86.4 kg)   BMI 28.95 kg/m   Wt Readings from Last 3 Encounters:  10/06/16 190 lb 6.4 oz (86.4 kg)  09/07/16 190 lb (86.2 kg)  07/07/16 184 lb 12.8 oz (83.8 kg)    Gen: NAD, alert, cooperative with exam, NCAT EYES: EOMI, no conjunctival injection, or no icterus LYMPH: no cervical LAD CV: WWP Resp: normal WOB Ext: No edema, warm Neuro: Alert and oriented, hand grip 5/5 b/l, strength equal b/l UE and LE MSK: normal ROM neck, no point tenderness over vertebrae  Assessment & Plan:  Mang was seen today for neck pain.  Diagnoses and all orders for this visit:  Neck pain Improved from last visit Goes away  with one aleve Not taking every day Minimally affecting life now Cr nl OK to cont NSAIDs, rest RTC if worsening   Follow up plan: Return if symptoms worsen or fail to improve. Assunta Found, MD [place]

## 2016-10-06 NOTE — Patient Instructions (Addendum)
Aspercreme for topical NSAID treatment of neck tightness

## 2016-10-19 DIAGNOSIS — G4733 Obstructive sleep apnea (adult) (pediatric): Secondary | ICD-10-CM | POA: Diagnosis not present

## 2016-10-30 ENCOUNTER — Ambulatory Visit: Payer: 59 | Admitting: Family Medicine

## 2016-11-02 ENCOUNTER — Encounter: Payer: Self-pay | Admitting: Family Medicine

## 2016-12-15 ENCOUNTER — Encounter: Payer: Self-pay | Admitting: Family Medicine

## 2016-12-15 ENCOUNTER — Ambulatory Visit (INDEPENDENT_AMBULATORY_CARE_PROVIDER_SITE_OTHER): Payer: 59 | Admitting: Family Medicine

## 2016-12-15 VITALS — BP 119/69 | HR 62 | Temp 97.1°F | Ht 68.0 in | Wt 186.4 lb

## 2016-12-15 DIAGNOSIS — N401 Enlarged prostate with lower urinary tract symptoms: Secondary | ICD-10-CM | POA: Diagnosis not present

## 2016-12-15 DIAGNOSIS — D485 Neoplasm of uncertain behavior of skin: Secondary | ICD-10-CM

## 2016-12-15 DIAGNOSIS — I10 Essential (primary) hypertension: Secondary | ICD-10-CM

## 2016-12-15 DIAGNOSIS — E559 Vitamin D deficiency, unspecified: Secondary | ICD-10-CM

## 2016-12-15 DIAGNOSIS — E785 Hyperlipidemia, unspecified: Secondary | ICD-10-CM

## 2016-12-15 NOTE — Progress Notes (Signed)
   HPI  Patient presents today here to follow-up for chronic medical conditions.  Skin lesion Right flank lesion present for about 4 weeks, seems to be getting bigger. Not irritated not bleeding Patient has also had a skin tag in the right axillary area for a few years, this is not irritated.  BPH Patient does not take Flomax, still has nocturia and weak stream.  Hypertension Good medication compliance, no chest pain, dyspnea, palpitations, leg edema.  Hyperlipidemia Taking atorvastatin.  Vitamin D deficiency Stopped vitamin D replacement when he finished 50,000 units once a week  PMH: Smoking status noted ROS: Per HPI  Objective: BP 119/69   Pulse 62   Temp 97.1 F (36.2 C) (Oral)   Ht _0  (1.727 m)   Wt 186 lb 6.4 oz (84.6 kg)   BMI 28.34 kg/m  Gen: NAD, alert, cooperative with exam HEENT: NCAT CV: RRR, good S1/S2, no murmur Resp: CTABL, no wheezes, non-labored Ext: No edema, warm Neuro: Alert and oriented, No gross deficits  Skin Right axillary area with pedunculated flesh-colored skin lesion consistent with skin tag.  Right flank with 3 mm x 3 mm hyperkeratotic lesion with small erythematous border, nontender to palpation with no exudate  Assessment and plan:  # Neoplasm of uncertain behavior of skin Return for biopsy in 2-4 weeks. Likely squamous cell carcinoma versus cutaneous horn  # Essential hypertension Well-controlled on amlodipine No changes Labs  # BPH PSA checked Does not take Flomax, clinically stable  # HLD Taking atorvastatin, clinically stable Recheck  Vitamin D deficiency Likely will need weekly replacement 12 weeks again, otherwise start 2000 units daily Recheck  Orders Placed This Encounter  Procedures  . CMP14+EGFR  . CBC with Differential/Platelet  . Lipid panel  . PSA  . VITAMIN D 25 Hydroxy (Vit-D Deficiency, Fractures)     Laroy Apple, MD [facility] 12/15/2016, 8:13 AM

## 2016-12-15 NOTE — Patient Instructions (Signed)
Great to see you!  Schedule an appointment for a skin lesion removal in the next 2-4 weeks.

## 2016-12-16 LAB — CBC WITH DIFFERENTIAL/PLATELET
BASOS: 0 %
Basophils Absolute: 0 10*3/uL (ref 0.0–0.2)
EOS (ABSOLUTE): 0.1 10*3/uL (ref 0.0–0.4)
EOS: 1 %
HEMATOCRIT: 40.3 % (ref 37.5–51.0)
HEMOGLOBIN: 13.8 g/dL (ref 13.0–17.7)
Immature Grans (Abs): 0 10*3/uL (ref 0.0–0.1)
Immature Granulocytes: 0 %
LYMPHS ABS: 1.9 10*3/uL (ref 0.7–3.1)
Lymphs: 36 %
MCH: 30 pg (ref 26.6–33.0)
MCHC: 34.2 g/dL (ref 31.5–35.7)
MCV: 88 fL (ref 79–97)
MONOS ABS: 0.5 10*3/uL (ref 0.1–0.9)
Monocytes: 9 %
NEUTROS ABS: 2.8 10*3/uL (ref 1.4–7.0)
Neutrophils: 54 %
PLATELETS: 263 10*3/uL (ref 150–379)
RBC: 4.6 x10E6/uL (ref 4.14–5.80)
RDW: 13.8 % (ref 12.3–15.4)
WBC: 5.2 10*3/uL (ref 3.4–10.8)

## 2016-12-16 LAB — CMP14+EGFR
A/G RATIO: 2 (ref 1.2–2.2)
ALBUMIN: 4.5 g/dL (ref 3.6–4.8)
ALT: 23 IU/L (ref 0–44)
AST: 19 IU/L (ref 0–40)
Alkaline Phosphatase: 74 IU/L (ref 39–117)
BUN / CREAT RATIO: 9 — AB (ref 10–24)
BUN: 8 mg/dL (ref 8–27)
Bilirubin Total: 0.7 mg/dL (ref 0.0–1.2)
CALCIUM: 9.4 mg/dL (ref 8.6–10.2)
CO2: 26 mmol/L (ref 18–29)
CREATININE: 0.88 mg/dL (ref 0.76–1.27)
Chloride: 100 mmol/L (ref 96–106)
GFR calc Af Amer: 108 mL/min/{1.73_m2} (ref >59)
GFR, EST NON AFRICAN AMERICAN: 93 mL/min/{1.73_m2} (ref >59)
GLOBULIN, TOTAL: 2.2 g/dL (ref 1.5–4.5)
Glucose: 93 mg/dL (ref 65–99)
Potassium: 4.3 mmol/L (ref 3.5–5.2)
SODIUM: 139 mmol/L (ref 134–144)
Total Protein: 6.7 g/dL (ref 6.0–8.5)

## 2016-12-16 LAB — LIPID PANEL
CHOL/HDL RATIO: 3.8 ratio (ref 0.0–5.0)
Cholesterol, Total: 138 mg/dL (ref 100–199)
HDL: 36 mg/dL — ABNORMAL LOW (ref >39)
LDL Calculated: 73 mg/dL (ref 0–99)
Triglycerides: 144 mg/dL (ref 0–149)
VLDL CHOLESTEROL CAL: 29 mg/dL (ref 5–40)

## 2016-12-16 LAB — PSA: Prostate Specific Ag, Serum: 1.6 ng/mL (ref 0.0–4.0)

## 2016-12-16 LAB — VITAMIN D 25 HYDROXY (VIT D DEFICIENCY, FRACTURES): Vit D, 25-Hydroxy: 26.7 ng/mL — ABNORMAL LOW (ref 30.0–100.0)

## 2016-12-17 ENCOUNTER — Other Ambulatory Visit: Payer: Self-pay | Admitting: Family Medicine

## 2016-12-17 MED ORDER — VITAMIN D (ERGOCALCIFEROL) 1.25 MG (50000 UNIT) PO CAPS: 50000.0000 [IU] | ORAL_CAPSULE | ORAL | 0 refills | Status: DC

## 2016-12-24 ENCOUNTER — Encounter: Payer: Self-pay | Admitting: Family Medicine

## 2016-12-24 ENCOUNTER — Ambulatory Visit (INDEPENDENT_AMBULATORY_CARE_PROVIDER_SITE_OTHER): Payer: 59 | Admitting: Family Medicine

## 2016-12-24 VITALS — BP 118/60 | HR 70 | Temp 97.4°F | Ht 68.0 in | Wt 185.8 lb

## 2016-12-24 DIAGNOSIS — D485 Neoplasm of uncertain behavior of skin: Secondary | ICD-10-CM

## 2016-12-24 DIAGNOSIS — L859 Epidermal thickening, unspecified: Secondary | ICD-10-CM | POA: Diagnosis not present

## 2016-12-24 MED ORDER — MUPIROCIN 2 % EX OINT: 1.0000 "application " | TOPICAL_OINTMENT | Freq: Two times a day (BID) | CUTANEOUS | 0 refills | Status: DC

## 2016-12-24 NOTE — Patient Instructions (Addendum)
Great to see you!  Come back in 4 months unless you need [place] sooner.   Do not soak in a tub until you are completely healed up from the wound. This may take up to 2 weeks.   Try to leave the original bandage in place until 24 hours after the procedure   Keep the area clean, apply a small amount of mupirocin, and a clean bandage at least once daily.

## 2016-12-24 NOTE — Addendum Note (Signed)
Addended by: Karle Plumber on: 12/24/2016 09:31 AM   Modules accepted: Orders

## 2016-12-24 NOTE — Progress Notes (Signed)
   HPI  Patient presents today here who has returned for biopsy.  Patient has a nonhealing papule on the right flank.  Since our last visit it has peeled off and has a small punctate lesion in the middle. He has not had any drainage or bleeding. It is not draining.  PMH: Smoking status noted ROS: Per HPI  Objective: BP 118/60   Pulse 70   Temp 97.4 F (36.3 C) (Oral)   Ht 5\' 8"  (1.727 m)   Wt 185 lb 12.8 oz (84.3 kg)   BMI 28.25 kg/m  Gen: NAD, alert, cooperative with exam HEENT: NCAT, EOMI, PERRL CV: RRR, good S1/S2, no murmur Resp: CTABL, no wheezes, non-labored  Skin:   flat, slightly scaled 5 mm x 4 mm area of erythema on the right flank with a small punctate erythemetous area centrally located.  Assessment and plan:  # Neoplasm of uncertain behavior of skin The wound has changed since our last visit. Now 5 mm X 4 Biopsied today Mupirocin for wound dressings Risks and benefits discussed thoroughly. discussed routine wound healing and wound care with the patient.  PT works 3 hours away, will call with any complications.    Laroy Apple, MD [facility] 12/24/2016, 9:02 AM

## 2016-12-30 LAB — PATHOLOGY

## 2016-12-31 ENCOUNTER — Telehealth: Payer: Self-pay | Admitting: Family Medicine

## 2016-12-31 NOTE — Telephone Encounter (Signed)
Patient talked to the pools nurse

## 2017-01-19 DIAGNOSIS — G4733 Obstructive sleep apnea (adult) (pediatric): Secondary | ICD-10-CM | POA: Diagnosis not present

## 2017-01-27 ENCOUNTER — Other Ambulatory Visit: Payer: Self-pay | Admitting: Family Medicine

## 2017-04-29 DIAGNOSIS — G4733 Obstructive sleep apnea (adult) (pediatric): Secondary | ICD-10-CM | POA: Diagnosis not present

## 2017-05-10 DIAGNOSIS — Z1211 Encounter for screening for malignant neoplasm of colon: Secondary | ICD-10-CM | POA: Diagnosis not present

## 2017-05-13 ENCOUNTER — Telehealth: Payer: Self-pay | Admitting: Family Medicine

## 2017-05-14 ENCOUNTER — Other Ambulatory Visit: Payer: Self-pay | Admitting: *Deleted

## 2017-05-14 DIAGNOSIS — E7800 Pure hypercholesterolemia, unspecified: Secondary | ICD-10-CM

## 2017-05-14 DIAGNOSIS — I10 Essential (primary) hypertension: Secondary | ICD-10-CM

## 2017-05-14 NOTE — Telephone Encounter (Signed)
Labs ordered.

## 2017-05-17 ENCOUNTER — Other Ambulatory Visit: Payer: 59

## 2017-05-17 DIAGNOSIS — E7800 Pure hypercholesterolemia, unspecified: Secondary | ICD-10-CM | POA: Diagnosis not present

## 2017-05-17 DIAGNOSIS — I10 Essential (primary) hypertension: Secondary | ICD-10-CM

## 2017-05-18 ENCOUNTER — Encounter: Payer: Self-pay | Admitting: Family Medicine

## 2017-05-18 DIAGNOSIS — D122 Benign neoplasm of ascending colon: Secondary | ICD-10-CM | POA: Diagnosis not present

## 2017-05-18 DIAGNOSIS — Z1211 Encounter for screening for malignant neoplasm of colon: Secondary | ICD-10-CM | POA: Diagnosis not present

## 2017-05-18 DIAGNOSIS — K635 Polyp of colon: Secondary | ICD-10-CM | POA: Diagnosis not present

## 2017-05-18 LAB — LIPID PANEL
CHOL/HDL RATIO: 3.6 ratio (ref 0.0–5.0)
CHOLESTEROL TOTAL: 162 mg/dL (ref 100–199)
HDL: 45 mg/dL (ref >39)
LDL CALC: 88 mg/dL (ref 0–99)
Triglycerides: 144 mg/dL (ref 0–149)
VLDL CHOLESTEROL CAL: 29 mg/dL (ref 5–40)

## 2017-05-18 LAB — CMP14+EGFR
ALBUMIN: 4.8 g/dL (ref 3.6–4.8)
ALT: 26 IU/L (ref 0–44)
AST: 28 IU/L (ref 0–40)
Albumin/Globulin Ratio: 2.2 (ref 1.2–2.2)
Alkaline Phosphatase: 73 IU/L (ref 39–117)
BUN / CREAT RATIO: 15 (ref 10–24)
BUN: 14 mg/dL (ref 8–27)
Bilirubin Total: 0.9 mg/dL (ref 0.0–1.2)
CALCIUM: 9.4 mg/dL (ref 8.6–10.2)
CO2: 23 mmol/L (ref 20–29)
CREATININE: 0.94 mg/dL (ref 0.76–1.27)
Chloride: 101 mmol/L (ref 96–106)
GFR calc Af Amer: 101 mL/min/{1.73_m2} (ref >59)
GFR, EST NON AFRICAN AMERICAN: 88 mL/min/{1.73_m2} (ref >59)
GLOBULIN, TOTAL: 2.2 g/dL (ref 1.5–4.5)
GLUCOSE: 90 mg/dL (ref 65–99)
Potassium: 4.3 mmol/L (ref 3.5–5.2)
Sodium: 141 mmol/L (ref 134–144)
TOTAL PROTEIN: 7 g/dL (ref 6.0–8.5)

## 2017-05-18 LAB — CBC WITH DIFFERENTIAL/PLATELET
BASOS ABS: 0 10*3/uL (ref 0.0–0.2)
Basos: 1 %
EOS (ABSOLUTE): 0.1 10*3/uL (ref 0.0–0.4)
EOS: 1 %
HEMATOCRIT: 40.5 % (ref 37.5–51.0)
HEMOGLOBIN: 13.8 g/dL (ref 13.0–17.7)
IMMATURE GRANS (ABS): 0 10*3/uL (ref 0.0–0.1)
IMMATURE GRANULOCYTES: 0 %
LYMPHS ABS: 2.3 10*3/uL (ref 0.7–3.1)
LYMPHS: 37 %
MCH: 29.9 pg (ref 26.6–33.0)
MCHC: 34.1 g/dL (ref 31.5–35.7)
MCV: 88 fL (ref 79–97)
MONOCYTES: 9 %
Monocytes Absolute: 0.6 10*3/uL (ref 0.1–0.9)
Neutrophils Absolute: 3.2 10*3/uL (ref 1.4–7.0)
Neutrophils: 52 %
Platelets: 252 10*3/uL (ref 150–379)
RBC: 4.62 x10E6/uL (ref 4.14–5.80)
RDW: 13.6 % (ref 12.3–15.4)
WBC: 6.1 10*3/uL (ref 3.4–10.8)

## 2017-05-28 ENCOUNTER — Encounter: Payer: Self-pay | Admitting: Hematology & Oncology

## 2017-07-06 ENCOUNTER — Ambulatory Visit (INDEPENDENT_AMBULATORY_CARE_PROVIDER_SITE_OTHER): Payer: 59 | Admitting: Family Medicine

## 2017-07-06 ENCOUNTER — Encounter: Payer: Self-pay | Admitting: Family Medicine

## 2017-07-06 VITALS — BP 117/78 | HR 66 | Temp 97.0°F | Ht 68.0 in | Wt 183.0 lb

## 2017-07-06 DIAGNOSIS — Z23 Encounter for immunization: Secondary | ICD-10-CM | POA: Diagnosis not present

## 2017-07-06 DIAGNOSIS — L259 Unspecified contact dermatitis, unspecified cause: Secondary | ICD-10-CM | POA: Diagnosis not present

## 2017-07-06 NOTE — Progress Notes (Signed)
Chief Complaint  Patient presents with  . Rash    pt here today c/o rash on scrotum for about a month and he was using fungal ointment but it didn't get any better so he switched and started using cortisone cream and now it is much better.    HPI  Patient presents today for Rash as noted above. He's had jock itch a lot in his life so he just assumed it was that and started putting an antifungal ointment on it. It was the generic for Lotrimin. Seem to just kept getting worse needs was scratching so much that it was drawing blood. It was primarily in the scrotum but also in the pubic region. It was not in the inguinal fold region. Several days ago he started using cortisone cream. Over-the-counter 1%. Since that time it has improved significantly. He just wants it looked at today to make sure that everything is progressing as expected since he works 3 hours away and can't get back any time soon. He would also like to have his flu shot for this reason.  PMH: Smoking status noted ROS: Per HPI  Objective: BP 117/78   Pulse 66   Temp (!) 97 F (36.1 C) (Oral)   Ht 5\' 8"  (1.727 m)   Wt 183 lb (83 kg)   BMI 27.83 kg/m  Gen: NAD, alert, cooperative with exam HEENT: NCAT, EOMI, PERRL  skin: Faint erythema of the pubic region adjacent to the base of the penis on the right. At the superior scrotum there is an area of lichenification with mild erythema on the right. Ext: No edema, warm Neuro: Alert and oriented, No gross deficits  Assessment and plan:  1. Contact dermatitis, unspecified contact dermatitis type, unspecified trigger     1% hydrocortisone cream applied twice a day to 3 times a day until rash clears. Notify the office if symptoms rebound.  Patient was reassured that this does not appear to have any neoplastic component.  Follow up as needed.  Claretta Fraise, MD

## 2017-07-18 ENCOUNTER — Other Ambulatory Visit: Payer: Self-pay | Admitting: Family Medicine

## 2017-08-04 DIAGNOSIS — G4733 Obstructive sleep apnea (adult) (pediatric): Secondary | ICD-10-CM | POA: Diagnosis not present

## 2017-10-22 ENCOUNTER — Encounter: Payer: Self-pay | Admitting: Family Medicine

## 2017-10-22 ENCOUNTER — Ambulatory Visit (INDEPENDENT_AMBULATORY_CARE_PROVIDER_SITE_OTHER): Payer: 59 | Admitting: Family Medicine

## 2017-10-22 VITALS — BP 117/70 | HR 76 | Temp 97.1°F | Ht 68.0 in | Wt 191.2 lb

## 2017-10-22 DIAGNOSIS — N401 Enlarged prostate with lower urinary tract symptoms: Secondary | ICD-10-CM

## 2017-10-22 DIAGNOSIS — E785 Hyperlipidemia, unspecified: Secondary | ICD-10-CM | POA: Diagnosis not present

## 2017-10-22 DIAGNOSIS — E559 Vitamin D deficiency, unspecified: Secondary | ICD-10-CM | POA: Diagnosis not present

## 2017-10-22 DIAGNOSIS — H6980 Other specified disorders of Eustachian tube, unspecified ear: Secondary | ICD-10-CM

## 2017-10-22 NOTE — Patient Instructions (Addendum)
Great to see you!  Come back in 6 months unless you need us sooner.    

## 2017-10-22 NOTE — Progress Notes (Signed)
   HPI  Patient presents today here for follow-up chronic medical conditions.  Patient feels well with no significant complaints.  He states that in his right ear he has had a "tweeting sound" intermittently.  He states that this started washing his ears out a few months ago.  Vitamin D deficiency Is did not consistently take 50,000 units, no longer taking supplementation.  Hyperlipidemia Patient doing well with Lipitor, tolerates well. Fasting  BPH_ would like PSA  PMH: Smoking status noted ROS: Per HPI  Objective: BP 117/70   Pulse 76   Temp (!) 97.1 F (36.2 C) (Oral)   Ht _0  (1.727 m)   Wt 191 lb 3.2 oz (86.7 kg)   BMI 29.07 kg/m  Gen: NAD, alert, cooperative with exam HEENT: NCAT CV: RRR, good S1/S2, no murmur Resp: CTABL, no wheezes, non-labored Ext: No edema, warm Neuro: Alert and oriented, No gross deficits  Assessment and plan:  #Hyperlipidemia Well-controlled, continue atorvastatin, labs today   #BPH Patient requesting PSA, ordered No meds, controlled  #Eustachian tube dysfunction Reassurance provided  #Vitamin D deficiency Mild to moderate, discussed daily replacement Repeat labs   Orders Placed This Encounter  Procedures  . CMP14+EGFR  . CBC with Differential/Platelet  . Lipid panel  . VITAMIN D 25 Hydroxy (Vit-D Deficiency, Fractures)  . Brooks, MD [facility] 10/22/2017, 9:12 AM

## 2017-10-23 LAB — CMP14+EGFR
ALK PHOS: 80 IU/L (ref 39–117)
ALT: 40 IU/L (ref 0–44)
AST: 27 IU/L (ref 0–40)
Albumin/Globulin Ratio: 1.7 (ref 1.2–2.2)
Albumin: 4.6 g/dL (ref 3.6–4.8)
BUN/Creatinine Ratio: 14 (ref 10–24)
BUN: 12 mg/dL (ref 8–27)
Bilirubin Total: 0.6 mg/dL (ref 0.0–1.2)
CALCIUM: 9.5 mg/dL (ref 8.6–10.2)
CO2: 23 mmol/L (ref 20–29)
CREATININE: 0.86 mg/dL (ref 0.76–1.27)
Chloride: 104 mmol/L (ref 96–106)
GFR calc Af Amer: 108 mL/min/{1.73_m2} (ref >59)
GFR, EST NON AFRICAN AMERICAN: 94 mL/min/{1.73_m2} (ref >59)
Globulin, Total: 2.7 g/dL (ref 1.5–4.5)
Glucose: 91 mg/dL (ref 65–99)
Potassium: 4.3 mmol/L (ref 3.5–5.2)
SODIUM: 142 mmol/L (ref 134–144)
Total Protein: 7.3 g/dL (ref 6.0–8.5)

## 2017-10-23 LAB — CBC WITH DIFFERENTIAL/PLATELET
BASOS: 1 %
Basophils Absolute: 0 10*3/uL (ref 0.0–0.2)
EOS (ABSOLUTE): 0.1 10*3/uL (ref 0.0–0.4)
EOS: 1 %
Hematocrit: 42.1 % (ref 37.5–51.0)
Hemoglobin: 14.7 g/dL (ref 13.0–17.7)
IMMATURE GRANS (ABS): 0 10*3/uL (ref 0.0–0.1)
IMMATURE GRANULOCYTES: 0 %
LYMPHS: 37 %
Lymphocytes Absolute: 2 10*3/uL (ref 0.7–3.1)
MCH: 30.4 pg (ref 26.6–33.0)
MCHC: 34.9 g/dL (ref 31.5–35.7)
MCV: 87 fL (ref 79–97)
MONOS ABS: 0.4 10*3/uL (ref 0.1–0.9)
Monocytes: 7 %
NEUTROS PCT: 54 %
Neutrophils Absolute: 3 10*3/uL (ref 1.4–7.0)
PLATELETS: 294 10*3/uL (ref 150–379)
RBC: 4.83 x10E6/uL (ref 4.14–5.80)
RDW: 13.7 % (ref 12.3–15.4)
WBC: 5.5 10*3/uL (ref 3.4–10.8)

## 2017-10-23 LAB — LIPID PANEL
Chol/HDL Ratio: 3.6 ratio (ref 0.0–5.0)
Cholesterol, Total: 160 mg/dL (ref 100–199)
HDL: 44 mg/dL (ref >39)
LDL Calculated: 95 mg/dL (ref 0–99)
Triglycerides: 105 mg/dL (ref 0–149)
VLDL CHOLESTEROL CAL: 21 mg/dL (ref 5–40)

## 2017-10-23 LAB — VITAMIN D 25 HYDROXY (VIT D DEFICIENCY, FRACTURES): Vit D, 25-Hydroxy: 12.7 ng/mL — ABNORMAL LOW (ref 30.0–100.0)

## 2017-10-23 LAB — PSA: Prostate Specific Ag, Serum: 1.4 ng/mL (ref 0.0–4.0)

## 2017-10-25 ENCOUNTER — Encounter: Payer: Self-pay | Admitting: *Deleted

## 2017-10-25 ENCOUNTER — Other Ambulatory Visit: Payer: Self-pay | Admitting: Family Medicine

## 2017-10-25 MED ORDER — VITAMIN D (ERGOCALCIFEROL) 1.25 MG (50000 UNIT) PO CAPS: 50000.0000 [IU] | ORAL_CAPSULE | ORAL | 0 refills | Status: DC

## 2017-10-25 NOTE — Progress Notes (Signed)
Pt aware of all labs 

## 2018-01-10 ENCOUNTER — Ambulatory Visit (INDEPENDENT_AMBULATORY_CARE_PROVIDER_SITE_OTHER): Payer: 59 | Admitting: Family Medicine

## 2018-01-10 ENCOUNTER — Encounter: Payer: Self-pay | Admitting: Family Medicine

## 2018-01-10 VITALS — BP 118/66 | HR 69 | Temp 97.6°F | Ht 68.0 in | Wt 192.0 lb

## 2018-01-10 DIAGNOSIS — R21 Rash and other nonspecific skin eruption: Secondary | ICD-10-CM | POA: Diagnosis not present

## 2018-01-10 DIAGNOSIS — R3916 Straining to void: Secondary | ICD-10-CM

## 2018-01-10 DIAGNOSIS — N401 Enlarged prostate with lower urinary tract symptoms: Secondary | ICD-10-CM | POA: Diagnosis not present

## 2018-01-10 DIAGNOSIS — R300 Dysuria: Secondary | ICD-10-CM

## 2018-01-10 LAB — URINALYSIS, COMPLETE
Bilirubin, UA: NEGATIVE
GLUCOSE, UA: NEGATIVE
Ketones, UA: NEGATIVE
Leukocytes, UA: NEGATIVE
Nitrite, UA: NEGATIVE
PROTEIN UA: NEGATIVE
RBC, UA: NEGATIVE
Specific Gravity, UA: 1.01 (ref 1.005–1.030)
UUROB: 0.2 mg/dL (ref 0.2–1.0)
pH, UA: 7.5 (ref 5.0–7.5)

## 2018-01-10 LAB — MICROSCOPIC EXAMINATION
Bacteria, UA: NONE SEEN
EPITHELIAL CELLS (NON RENAL): NONE SEEN /HPF (ref 0–10)
RBC, UA: NONE SEEN /hpf (ref 0–2)
Renal Epithel, UA: NONE SEEN /hpf
WBC, UA: NONE SEEN /hpf (ref 0–5)

## 2018-01-10 MED ORDER — TAMSULOSIN HCL 0.4 MG PO CAPS: 0.4000 mg | ORAL_CAPSULE | Freq: Every day | ORAL | 3 refills | Status: DC

## 2018-01-10 MED ORDER — KETOCONAZOLE 2 % EX CREA: 1.0000 "application " | TOPICAL_CREAM | Freq: Two times a day (BID) | CUTANEOUS | 0 refills | Status: DC

## 2018-01-10 NOTE — Patient Instructions (Signed)
Great to see you!   

## 2018-01-10 NOTE — Progress Notes (Signed)
   HPI  Patient presents today here with dysuria.  Patient explains he has had it persistent for about 2 weeks.  He denies any fever, chills, sweats. Patient states that it is mostly after he urinates or when he has the urge to urinate that he feels the burning pain. He states that it has been improving. He has increased his water intake.  Patient does have symptoms of BPH including straining at urination, nocturia, and incomplete bladder emptying if sitting down to urinate. Has tolerated Flomax well previously when he was younger.  Patient also has a scrotal rash that has been present for greater than 1 month. Not responding to hydrocortisone. Also has not responded to Lamisil and clotrimazole plus other antifungals.  PMH: Smoking status noted ROS: Per HPI  Objective: BP 118/66   Pulse 69   Temp 97.6 F (36.4 C) (Oral)   Ht 5\' 8"  (1.727 m)   Wt 192 lb (87.1 kg)   BMI 29.19 kg/m  Gen: NAD, alert, cooperative with exam HEENT: NCAT CV: RRR, good S1/S2, no murmur Resp: CTABL, no wheezes, non-labored Ext: No edema, warm Neuro: Alert and oriented, No gross deficits Skin Erythema of the scrotum with well circumscribed margin measuring approximately 4 cm x 6 cm  Assessment and plan:  #Dysuria Urinalysis is reassuring, urine culture   #BPH Starting Flomax PSA in January was normal  #Scrotal rash Trial of ketoconazole cream, could try a short course of Lamisil orally if needed    Orders Placed This Encounter  Procedures  . Urine Culture  . Urinalysis, Complete    Meds ordered this encounter  Medications  . ketoconazole (NIZORAL) 2 % cream    Sig: Apply 1 application topically 2 (two) times daily.    Dispense:  30 g    Refill:  0  . tamsulosin (FLOMAX) 0.4 MG CAPS capsule    Sig: Take 1 capsule (0.4 mg total) by mouth daily.    Dispense:  30 capsule    Refill:  Goodwin, MD [facility] 01/10/2018, 8:45 AM

## 2018-01-11 LAB — URINE CULTURE: Organism ID, Bacteria: NO GROWTH

## 2018-01-21 ENCOUNTER — Other Ambulatory Visit: Payer: Self-pay

## 2018-01-21 ENCOUNTER — Other Ambulatory Visit: Payer: Self-pay | Admitting: Family Medicine

## 2018-01-21 MED ORDER — VITAMIN D (ERGOCALCIFEROL) 1.25 MG (50000 UNIT) PO CAPS: 50000.0000 [IU] | ORAL_CAPSULE | ORAL | 0 refills | Status: DC

## 2018-01-21 NOTE — Telephone Encounter (Signed)
Last seen 01/10/18  Dr Wendi Snipes  Last Vit D 10/22/16  12.7

## 2018-01-25 ENCOUNTER — Telehealth: Payer: Self-pay | Admitting: Family Medicine

## 2018-01-25 MED ORDER — OXICONAZOLE NITRATE 1 % EX CREA: 1.0000 "application " | TOPICAL_CREAM | Freq: Two times a day (BID) | CUTANEOUS | 0 refills | Status: DC

## 2018-01-25 MED ORDER — TERBINAFINE HCL 250 MG PO TABS
250.0000 mg | ORAL_TABLET | Freq: Every day | ORAL | 0 refills | Status: DC
Start: 2018-01-25 — End: 2018-01-26

## 2018-01-25 NOTE — Telephone Encounter (Signed)
He is really concerned about the pill . He is really wanting to do something topical  He states he had some lesion on his liver years ago and he doesn't want to take a chance - unless its the only option.

## 2018-01-25 NOTE — Telephone Encounter (Signed)
DR Wendi Snipes - I set up the pharm for you - can you refill if you approve

## 2018-01-25 NOTE — Addendum Note (Signed)
Addended by: Timmothy Euler on: 01/25/2018 01:56 PM   Modules accepted: Orders

## 2018-01-25 NOTE — Telephone Encounter (Signed)
Patient aware and verbalizes understanding. 

## 2018-01-25 NOTE — Telephone Encounter (Signed)
Pt is hesitant to take lamisil PO due to a previous liver lesion years ago.   I will change to a different azole topical.   Laroy Apple, MD [facility] 01/25/2018, 1:41 PM

## 2018-01-25 NOTE — Telephone Encounter (Signed)
I am glad to hear that ketoconazole helped. Will treat more aggresively for persistent tinea cruris with oral lamisil X 2 weeks.   Laroy Apple, MD [facility] 01/25/2018, 11:19 AM

## 2018-01-26 ENCOUNTER — Telehealth: Payer: Self-pay | Admitting: *Deleted

## 2018-01-26 MED ORDER — CLOTRIMAZOLE-BETAMETHASONE 1-0.05 % EX CREA: 1.0000 "application " | TOPICAL_CREAM | Freq: Two times a day (BID) | CUTANEOUS | 0 refills | Status: DC

## 2018-01-26 NOTE — Telephone Encounter (Signed)
oxiconbazole too expensive, incomplete resolution with ketoconazole. Trial of lotrisone.   Laroy Apple, MD [facility] 01/26/2018, 11:51 AM

## 2018-01-26 NOTE — Telephone Encounter (Signed)
Pt is going to get the Oxiconazole cream the pharmacy did not have it in stock yesterday and was going to have to order it. He is going to contact them

## 2018-01-26 NOTE — Telephone Encounter (Signed)
Fax received [facility] requested Oxiconazole nitrate cream is $79 is there anything cheaper Please advise

## 2018-02-26 ENCOUNTER — Emergency Department (HOSPITAL_COMMUNITY): Payer: 59

## 2018-02-26 ENCOUNTER — Emergency Department (HOSPITAL_COMMUNITY)
Admission: EM | Admit: 2018-02-26 | Discharge: 2018-02-26 | Disposition: A | Discharge status: Home / Self Care | Payer: 59 | Attending: Emergency Medicine | Admitting: Emergency Medicine

## 2018-02-26 ENCOUNTER — Encounter (HOSPITAL_COMMUNITY): Payer: Self-pay | Admitting: Emergency Medicine

## 2018-02-26 ENCOUNTER — Other Ambulatory Visit: Payer: Self-pay

## 2018-02-26 DIAGNOSIS — Z7982 Long term (current) use of aspirin: Secondary | ICD-10-CM | POA: Insufficient documentation

## 2018-02-26 DIAGNOSIS — Z79899 Other long term (current) drug therapy: Secondary | ICD-10-CM | POA: Insufficient documentation

## 2018-02-26 DIAGNOSIS — R110 Nausea: Secondary | ICD-10-CM

## 2018-02-26 DIAGNOSIS — E876 Hypokalemia: Secondary | ICD-10-CM

## 2018-02-26 DIAGNOSIS — Z86711 Personal history of pulmonary embolism: Secondary | ICD-10-CM | POA: Diagnosis not present

## 2018-02-26 DIAGNOSIS — Z85828 Personal history of other malignant neoplasm of skin: Secondary | ICD-10-CM | POA: Diagnosis not present

## 2018-02-26 DIAGNOSIS — R1084 Generalized abdominal pain: Secondary | ICD-10-CM | POA: Insufficient documentation

## 2018-02-26 DIAGNOSIS — I10 Essential (primary) hypertension: Secondary | ICD-10-CM | POA: Insufficient documentation

## 2018-02-26 DIAGNOSIS — Z87891 Personal history of nicotine dependence: Secondary | ICD-10-CM | POA: Insufficient documentation

## 2018-02-26 DIAGNOSIS — Z85038 Personal history of other malignant neoplasm of large intestine: Secondary | ICD-10-CM | POA: Diagnosis not present

## 2018-02-26 DIAGNOSIS — R109 Unspecified abdominal pain: Secondary | ICD-10-CM

## 2018-02-26 DIAGNOSIS — R197 Diarrhea, unspecified: Secondary | ICD-10-CM | POA: Diagnosis not present

## 2018-02-26 DIAGNOSIS — Z91040 Latex allergy status: Secondary | ICD-10-CM | POA: Insufficient documentation

## 2018-02-26 DIAGNOSIS — R1110 Vomiting, unspecified: Secondary | ICD-10-CM | POA: Diagnosis not present

## 2018-02-26 LAB — CBC WITH DIFFERENTIAL/PLATELET
Basophils Absolute: 0 10*3/uL (ref 0.0–0.1)
Basophils Relative: 0 %
EOS ABS: 0 10*3/uL (ref 0.0–0.7)
Eosinophils Relative: 0 %
HEMATOCRIT: 44.6 % (ref 39.0–52.0)
HEMOGLOBIN: 15.7 g/dL (ref 13.0–17.0)
LYMPHS ABS: 0.5 10*3/uL — AB (ref 0.7–4.0)
Lymphocytes Relative: 6 %
MCH: 30 pg (ref 26.0–34.0)
MCHC: 35.2 g/dL (ref 30.0–36.0)
MCV: 85.3 fL (ref 78.0–100.0)
MONOS PCT: 3 %
Monocytes Absolute: 0.3 10*3/uL (ref 0.1–1.0)
NEUTROS PCT: 91 %
Neutro Abs: 7.2 10*3/uL (ref 1.7–7.7)
Platelets: 239 10*3/uL (ref 150–400)
RBC: 5.23 MIL/uL (ref 4.22–5.81)
RDW: 13.2 % (ref 11.5–15.5)
WBC: 8 10*3/uL (ref 4.0–10.5)

## 2018-02-26 LAB — URINALYSIS, ROUTINE W REFLEX MICROSCOPIC
BILIRUBIN URINE: NEGATIVE
Glucose, UA: NEGATIVE mg/dL
HGB URINE DIPSTICK: NEGATIVE
Ketones, ur: NEGATIVE mg/dL
Leukocytes, UA: NEGATIVE
Nitrite: NEGATIVE
PH: 5 (ref 5.0–8.0)
Protein, ur: NEGATIVE mg/dL

## 2018-02-26 LAB — COMPREHENSIVE METABOLIC PANEL
ALBUMIN: 4.1 g/dL (ref 3.5–5.0)
ALT: 32 U/L (ref 17–63)
ANION GAP: 11 (ref 5–15)
AST: 28 U/L (ref 15–41)
Alkaline Phosphatase: 66 U/L (ref 38–126)
BILIRUBIN TOTAL: 1.3 mg/dL — AB (ref 0.3–1.2)
BUN: 14 mg/dL (ref 6–20)
CO2: 21 mmol/L — AB (ref 22–32)
Calcium: 9 mg/dL (ref 8.9–10.3)
Chloride: 101 mmol/L (ref 101–111)
Creatinine, Ser: 0.97 mg/dL (ref 0.61–1.24)
GFR calc Af Amer: >60 mL/min (ref >60)
GFR calc non Af Amer: >60 mL/min (ref >60)
GLUCOSE: 127 mg/dL — AB (ref 65–99)
POTASSIUM: 3.1 mmol/L — AB (ref 3.5–5.1)
SODIUM: 133 mmol/L — AB (ref 135–145)
TOTAL PROTEIN: 7.5 g/dL (ref 6.5–8.1)

## 2018-02-26 LAB — MAGNESIUM: MAGNESIUM: 1.7 mg/dL (ref 1.7–2.4)

## 2018-02-26 LAB — TROPONIN I: Troponin I: <0.03 ng/mL (ref <0.03)

## 2018-02-26 LAB — LIPASE, BLOOD: Lipase: 83 U/L — ABNORMAL HIGH (ref 11–51)

## 2018-02-26 MED ORDER — POTASSIUM CHLORIDE CRYS ER 20 MEQ PO TBCR
40.0000 meq | EXTENDED_RELEASE_TABLET | Freq: Once | ORAL | Status: AC
  Administered 2018-02-26: 40 meq via ORAL
  Filled 2018-02-26: qty 2

## 2018-02-26 MED ORDER — POTASSIUM CHLORIDE 10 MEQ/100ML IV SOLN: 10.0000 meq | Freq: Once | INTRAVENOUS | Status: DC

## 2018-02-26 MED ORDER — ONDANSETRON HCL 4 MG/2ML IJ SOLN
4.0000 mg | INTRAMUSCULAR | Status: DC | PRN
  Administered 2018-02-26: 4 mg via INTRAVENOUS
  Filled 2018-02-26: qty 2

## 2018-02-26 MED ORDER — MAGNESIUM OXIDE 400 (241.3 MG) MG PO TABS
200.0000 mg | ORAL_TABLET | Freq: Once | ORAL | Status: AC
  Administered 2018-02-26: 200 mg via ORAL
  Filled 2018-02-26: qty 1

## 2018-02-26 MED ORDER — MORPHINE SULFATE (PF) 4 MG/ML IV SOLN
4.0000 mg | INTRAVENOUS | Status: DC | PRN
  Administered 2018-02-26: 4 mg via INTRAVENOUS
  Filled 2018-02-26: qty 1

## 2018-02-26 MED ORDER — ONDANSETRON 4 MG PO TBDP: 4.0000 mg | ORAL_TABLET | Freq: Three times a day (TID) | ORAL | 0 refills | Status: DC | PRN

## 2018-02-26 MED ORDER — IOPAMIDOL (ISOVUE-300) INJECTION 61%
100.0000 mL | Freq: Once | INTRAVENOUS | Status: AC | PRN
  Administered 2018-02-26: 100 mL via INTRAVENOUS

## 2018-02-26 MED ORDER — SODIUM CHLORIDE 0.9 % IV BOLUS: 500.0000 mL | Freq: Once | INTRAVENOUS | Status: DC

## 2018-02-26 MED ORDER — SODIUM CHLORIDE 0.9 % IV SOLN
INTRAVENOUS | Status: DC
  Administered 2018-02-26: 17:00:00 via INTRAVENOUS

## 2018-02-26 NOTE — ED Notes (Signed)
Pt given water 

## 2018-02-26 NOTE — Discharge Instructions (Signed)
Take the prescription as directed. Take over the counter anti-gas medication (such as simethicone), as directed on packaging, as needed for bloating or excessive gas.  Increase your fluid intake (ie:  Gatoraide) for the next few days, as discussed.  Eat a bland diet and advance to your regular diet slowly as you can tolerate it.   Avoid full strength juices, as well as milk and milk products until your diarrhea has resolved.   Call your regular medical doctor Monday to schedule a follow up appointment in the next 2 to 3 days. Return to the Emergency Department immediately if not improving (or even worsening) despite taking the medicines as prescribed, any black or bloody stool or vomit, if you develop a fever over "101," or for any other concerns.

## 2018-02-26 NOTE — ED Notes (Signed)
Patient in [place] at this time.

## 2018-02-26 NOTE — ED Provider Notes (Signed)
[facility] EMERGENCY DEPARTMENT Provider Note   CSN: [id number] Arrival date & time: 02/26/18  1536     History   Chief Complaint Chief Complaint  Patient presents with  . Abdominal Pain    HPI Cody Hernandez is a 62 y.o. male.  HPI Pt was seen at 1635.  Per pt, c/o gradual onset and persistence of constant generalized abd "pain" for the past 2 days.  Has been associated with nausea and multiple intermittent episodes of diarrhea.  Describes the abd pain as "bloating" and "like when I had a bowel obstruction." Describes the stools as "watery."  Denies vomiting, no fevers, no back pain, no rash, no CP/SOB, no black or blood in stools.      Past Medical History:  Diagnosis Date  . Blood clotting disorder ([place])    DVT/PE x2 Coumadin x 2 years 2006  . Colon cancer ([place])   . High cholesterol   . Hypertension   . Pulmonary embolism ([place])   . Sleep apnea     Patient Active Problem List   Diagnosis Date Noted  . Neoplasm of uncertain behavior of skin 12/15/2016  . BPH (benign prostatic hyperplasia) 07/07/2016  . Constipation 06/30/2016  . Metabolic syndrome 16/96/7893  . Vitamin D deficiency 01/01/2015  . HTN (hypertension) 01/08/2014  . HLD (hyperlipidemia) 01/08/2014  . Allergic rhinitis 10/04/2013  . Colon cancer ([place])   . Blood clotting disorder ([place])   . Pulmonary embolism ([place])   . Obstructive sleep apnea 06/04/2011    Past Surgical History:  Procedure Laterality Date  . APPENDECTOMY  1977  . COLON SURGERY  2007  . neck/back surgery  1995 and 2010        Home Medications    Prior to Admission medications   Medication Sig Start Date End Date Taking? Authorizing Provider  amLODipine (NORVASC) 5 MG tablet TAKE 1 TABLET DAILY 07/19/17   Timmothy Euler, MD  aspirin 81 MG tablet Take 81 mg by mouth daily.      [provider]  atorvastatin (LIPITOR) 40 MG tablet TAKE 1 TABLET DAILY 07/19/17   Timmothy Euler, MD  clotrimazole-betamethasone  (LOTRISONE) cream Apply 1 application topically 2 (two) times daily. 01/26/18   Timmothy Euler, MD  tamsulosin (FLOMAX) 0.4 MG CAPS capsule Take 1 capsule (0.4 mg total) by mouth daily. 01/10/18   Timmothy Euler, MD  Vitamin D, Ergocalciferol, (DRISDOL) 50000 units CAPS capsule Take 1 capsule (50,000 Units total) by mouth every 7 (seven) days. 01/21/18   Timmothy Euler, MD    Family History Family History  Problem Relation Age of Onset  . Emphysema Paternal Grandfather   . Allergies Sister   . Allergies Mother   . Pulmonary embolism Paternal Grandmother     Social History Social History   Tobacco Use  . Smoking status: Former Smoker    Packs/day: 1.00    Years: 20.00    Pack years: 20.00    Types: Cigarettes    Last attempt to quit: 10/12/1992    Years since quitting: 25.3  . Smokeless tobacco: Never Used  Substance Use Topics  . Alcohol use: Yes    Alcohol/week: 0.0 oz    Comment: 8oz  . Drug use: No     Allergies   Benadryl [diphenhydramine hcl] and Latex   Review of Systems Review of Systems ROS: Statement: All systems negative except as marked or noted in the HPI; Constitutional: Negative for fever and chills. ; ;  Eyes: Negative for eye pain, redness and discharge. ; ; ENMT: Negative for ear pain, hoarseness, nasal congestion, sinus pressure and sore throat. ; ; Cardiovascular: Negative for chest pain, palpitations, diaphoresis, dyspnea and peripheral edema. ; ; Respiratory: Negative for cough, wheezing and stridor. ; ; Gastrointestinal: +nausea, diarrhea, abd pain. Negative for vomiting, blood in stool, hematemesis, jaundice and rectal bleeding. . ; ; Genitourinary: Negative for dysuria, flank pain and hematuria. ; ; Musculoskeletal: Negative for back pain and neck pain. Negative for swelling and trauma.; ; Skin: Negative for pruritus, rash, abrasions, blisters, bruising and skin lesion.; ; Neuro: Negative for headache, lightheadedness and neck stiffness. Negative  for weakness, altered level of consciousness, altered mental status, extremity weakness, paresthesias, involuntary movement, seizure and syncope.       Physical Exam Updated Vital Signs BP (!) 143/111 (BP Location: Right Arm)   Pulse 89   Temp 99.5 F (37.5 C) (Oral)   Resp (!) 24   Ht 5\' 8"  (1.727 m)   Wt 86.2 kg (190 lb)   SpO2 100%   BMI 28.89 kg/m    BP (!) 141/88   Pulse 96   Temp 99.5 F (37.5 C) (Oral)   Resp 15   Ht 5\' 8"  (1.727 m)   Wt 86.2 kg (190 lb)   SpO2 93%   BMI 28.89 kg/m    Physical Exam 1640: Physical examination:  Nursing notes reviewed; Vital signs and O2 SAT reviewed;  Constitutional: Well developed, Well nourished, Well hydrated, Uncomfortable appearing.; Head:  Normocephalic, atraumatic; Eyes: EOMI, PERRL, No scleral icterus; ENMT: Mouth and pharynx normal, Mucous membranes moist; Neck: Supple, Full range of motion, No lymphadenopathy; Cardiovascular: Regular rate and rhythm, No gallop; Respiratory: Breath sounds clear & equal bilaterally, No wheezes.  Speaking full sentences with ease, Normal respiratory effort/excursion; Chest: Nontender, Movement normal; Abdomen: Soft, +diffuse tenderness to palp. No rebound or guarding. Nondistended, Normal bowel sounds; Genitourinary: No CVA tenderness; Extremities: Peripheral pulses normal, No tenderness, No edema, No calf edema or asymmetry.; Neuro: AA&Ox3, Major CN grossly intact.  Speech clear. No gross focal motor or sensory deficits in extremities.; Skin: Color normal, Warm, Dry.   ED Treatments / Results  Labs (all labs ordered are listed, but only abnormal results are displayed)   EKG EKG Interpretation  Date/Time:  Saturday Feb 26 2018 17:08:30 EDT Ventricular Rate:  99 PR Interval:    QRS Duration: 109 QT Interval:  417 QTC Calculation: 536 R Axis:   95 Text Interpretation:  Sinus rhythm Right axis deviation Borderline T wave abnormalities Prolonged QT interval When compared with ECG of  09/26/2008 QT has lengthened and Rate faster Confirmed by Francine Graven [phone]) on 02/26/2018 5:20:33 PM   Radiology   Procedures Procedures (including critical care time)  Medications Ordered in ED Medications  morphine 4 MG/ML injection 4 mg (has no administration in time range)  ondansetron (ZOFRAN) injection 4 mg (has no administration in time range)  0.9 %  sodium chloride infusion (has no administration in time range)     Initial Impression / Assessment and Plan / ED Course  I have reviewed the triage vital signs and the nursing notes.  Pertinent labs & imaging results that were available during my care of the patient were reviewed by me and considered in my medical decision making (see chart for details).  MDM Reviewed: previous chart, nursing note and vitals Reviewed previous: labs and ECG Interpretation: labs, ECG, x-ray and CT scan   Results for orders placed  or performed during the hospital encounter of 02/26/18  Comprehensive metabolic panel  Result Value Ref Range   Sodium 133 (L) 135 - 145 mmol/L   Potassium 3.1 (L) 3.5 - 5.1 mmol/L   Chloride 101 101 - 111 mmol/L   CO2 21 (L) 22 - 32 mmol/L   Glucose, Bld 127 (H) 65 - 99 mg/dL   BUN 14 6 - 20 mg/dL   Creatinine, Ser 0.97 0.61 - 1.24 mg/dL   Calcium 9.0 8.9 - 10.3 mg/dL   Total Protein 7.5 6.5 - 8.1 g/dL   Albumin 4.1 3.5 - 5.0 g/dL   AST 28 15 - 41 U/L   ALT 32 17 - 63 U/L   Alkaline Phosphatase 66 38 - 126 U/L   Total Bilirubin 1.3 (H) 0.3 - 1.2 mg/dL   GFR calc non Af Amer >60 >60 mL/min   GFR calc Af Amer >60 >60 mL/min   Anion gap 11 5 - 15  Lipase, blood  Result Value Ref Range   Lipase 83 (H) 11 - 51 U/L  Troponin I  Result Value Ref Range   Troponin I <0.03 <0.03 ng/mL  CBC with Differential  Result Value Ref Range   WBC 8.0 4.0 - 10.5 K/uL   RBC 5.23 4.22 - 5.81 MIL/uL   Hemoglobin 15.7 13.0 - 17.0 g/dL   HCT 44.6 39.0 - 52.0 %   MCV 85.3 78.0 - 100.0 fL   MCH 30.0 26.0 - 34.0 pg     MCHC 35.2 30.0 - 36.0 g/dL   RDW 13.2 11.5 - 15.5 %   Platelets 239 150 - 400 K/uL   Neutrophils Relative % 91 %   Neutro Abs 7.2 1.7 - 7.7 K/uL   Lymphocytes Relative 6 %   Lymphs Abs 0.5 (L) 0.7 - 4.0 K/uL   Monocytes Relative 3 %   Monocytes Absolute 0.3 0.1 - 1.0 K/uL   Eosinophils Relative 0 %   Eosinophils Absolute 0.0 0.0 - 0.7 K/uL   Basophils Relative 0 %   Basophils Absolute 0.0 0.0 - 0.1 K/uL  Urinalysis, Routine w reflex microscopic  Result Value Ref Range   Color, Urine YELLOW YELLOW   APPearance CLEAR CLEAR   Specific Gravity, Urine >1.046 (H) 1.005 - 1.030   pH 5.0 5.0 - 8.0   Glucose, UA NEGATIVE NEGATIVE mg/dL   Hgb urine dipstick NEGATIVE NEGATIVE   Bilirubin Urine NEGATIVE NEGATIVE   Ketones, ur NEGATIVE NEGATIVE mg/dL   Protein, ur NEGATIVE NEGATIVE mg/dL   Nitrite NEGATIVE NEGATIVE   Leukocytes, UA NEGATIVE NEGATIVE  Magnesium  Result Value Ref Range   Magnesium 1.7 1.7 - 2.4 mg/dL   Dg Chest 2 View Result Date: 02/26/2018 CLINICAL DATA:  Abdominal pain, diarrhea, nausea. EXAM: CHEST - 2 VIEW COMPARISON:  Chest CT 03/21/2010 FINDINGS: Heart and mediastinal contours are within normal limits. No focal opacities or effusions. No acute bony abnormality. IMPRESSION: No active cardiopulmonary disease. Electronically Signed   By: Rolm Baptise M.D.   On: 02/26/2018 18:37   Ct Abdomen Pelvis W Contrast Result Date: 02/26/2018 CLINICAL DATA:  Abdominal pain, nausea, vomiting EXAM: CT ABDOMEN AND PELVIS WITH CONTRAST TECHNIQUE: Multidetector CT imaging of the abdomen and pelvis was performed using the standard protocol following bolus administration of intravenous contrast. CONTRAST:  149mL ISOVUE-300 IOPAMIDOL (ISOVUE-300) INJECTION 61% COMPARISON:  03/21/2010 FINDINGS: Lower chest: Linear scarring in the lung bases. Heart is normal size. No effusions. Hepatobiliary: Numerous small low-density lesions scattered throughout the liver,  stable since prior study compatible  with small cysts. Gallbladder unremarkable. Pancreas: No focal abnormality or ductal dilatation. Spleen: No focal abnormality.  Normal size. Adrenals/Urinary Tract: Small parapelvic cysts on the left. No hydronephrosis. Adrenal glands and urinary bladder unremarkable. Stomach/Bowel: Postoperative changes in the rectosigmoid colon. Mild gaseous distention of the colon with air-fluid levels. Postoperative changes in the region of the terminal ileum. No evidence of bowel obstruction. Vascular/Lymphatic: No evidence of aneurysm or adenopathy. Reproductive: No visible focal abnormality. Other: No free fluid or free air. Musculoskeletal: No acute bony abnormality. Postoperative changes in the lower lumbar spine. IMPRESSION: Mild gaseous distention of the colon with scattered liquid school and air-fluid levels. This is likely related to diarrhea. No evidence of bowel obstruction. Stable hepatic cysts. Electronically Signed   By: Rolm Baptise M.D.   On: 02/26/2018 18:47    2045:  Potassium and magnesium repleted PO. IVF given for elevated SG of urine (pt does not want to stay for entire 1L of IVF). Lipase elevated, but CT reassuring. Pt has tol PO well while in the ED without N/V.  No stooling while in the ED.  Abd benign, VSS. Feels better and wants to go home now. Tx symptomatically at this time. Dx and testing d/w pt and family.  Questions answered.  Verb understanding, agreeable to d/c home with outpt f/u.    Final Clinical Impressions(s) / ED Diagnoses   Final diagnoses:  None    ED Discharge Orders    None       Francine Graven, DO 03/02/18 [id number]

## 2018-02-26 NOTE — ED Triage Notes (Signed)
Pt believes he has a bowel obstruction, has had one in the past.  No appetite, all over abd pain, nausea and diarrhea that appears to be clear liquids x3 days

## 2018-04-20 ENCOUNTER — Encounter: Payer: Self-pay | Admitting: Family Medicine

## 2018-04-20 ENCOUNTER — Ambulatory Visit (INDEPENDENT_AMBULATORY_CARE_PROVIDER_SITE_OTHER): Payer: 59 | Admitting: Family Medicine

## 2018-04-20 VITALS — BP 128/76 | HR 72 | Temp 97.8°F | Ht 68.0 in | Wt 187.8 lb

## 2018-04-20 DIAGNOSIS — I10 Essential (primary) hypertension: Secondary | ICD-10-CM | POA: Diagnosis not present

## 2018-04-20 DIAGNOSIS — E785 Hyperlipidemia, unspecified: Secondary | ICD-10-CM

## 2018-04-20 DIAGNOSIS — G8929 Other chronic pain: Secondary | ICD-10-CM | POA: Diagnosis not present

## 2018-04-20 DIAGNOSIS — M25562 Pain in left knee: Secondary | ICD-10-CM | POA: Diagnosis not present

## 2018-04-20 MED ORDER — ATORVASTATIN CALCIUM 40 MG PO TABS: 40.0000 mg | ORAL_TABLET | Freq: Every day | ORAL | 1 refills | Status: DC

## 2018-04-20 MED ORDER — AMLODIPINE BESYLATE 5 MG PO TABS: 5.0000 mg | ORAL_TABLET | Freq: Every day | ORAL | 1 refills | Status: DC

## 2018-04-20 NOTE — Progress Notes (Signed)
   HPI  Patient presents today for follow-up chronic medical conditions.  Hypertension Good medication compliance and tolerance.  Hyperlipidemia Tolerating Lipitor well, nonfasting today.  Patient has had some left knee pain since he has been working on a project over the last 2 months. He is not interested in surgery or procedures to the knee at this point.   PMH: Smoking status noted ROS: Per HPI  Objective: BP 128/76   Pulse 72   Temp 97.8 F (36.6 C) (Oral)   Ht 5\' 8"  (1.727 m)   Wt 187 lb 12.8 oz (85.2 kg)   BMI 28.55 kg/m  Gen: NAD, alert, cooperative with exam HEENT: NCAT CV: RRR, good S1/S2, no murmur Resp: CTABL, no wheezes, non-labored Ext: No edema, warm Neuro: Alert and oriented, No gross deficits  Assessment and plan:  #Pretension Well-controlled with amlodipine, no changes Labs up-to-date  #Hyperlipidemia Tolerating Lipitor well, labs up-to-date Refill Lipitor Labs due next January  #Left knee pain Likely arthritis with flare with recent increase in use Offered x-ray, discussed supportive care He declines x-ray at this time    Meds ordered this encounter  Medications  . atorvastatin (LIPITOR) 40 MG tablet    Sig: Take 1 tablet (40 mg total) by mouth daily.    Dispense:  90 tablet    Refill:  1  . amLODipine (NORVASC) 5 MG tablet    Sig: Take 1 tablet (5 mg total) by mouth daily.    Dispense:  90 tablet    Refill:  [place], MD [facility] 04/20/2018, 4:21 PM

## 2018-04-20 NOTE — Patient Instructions (Signed)
Great to see you!  Come back with any concerns, otherwise come back in 6 months for an annual physical exam to see Dr. Evette Doffing or Dr. Lajuana Ripple.

## 2018-06-29 DIAGNOSIS — G4733 Obstructive sleep apnea (adult) (pediatric): Secondary | ICD-10-CM | POA: Diagnosis not present

## 2018-07-18 ENCOUNTER — Encounter: Payer: Self-pay | Admitting: Family Medicine

## 2018-07-18 ENCOUNTER — Ambulatory Visit (INDEPENDENT_AMBULATORY_CARE_PROVIDER_SITE_OTHER): Payer: 59 | Admitting: Family Medicine

## 2018-07-18 VITALS — BP 108/77 | HR 68 | Temp 98.1°F | Ht 68.0 in | Wt 178.0 lb

## 2018-07-18 DIAGNOSIS — Z23 Encounter for immunization: Secondary | ICD-10-CM

## 2018-07-18 DIAGNOSIS — E782 Mixed hyperlipidemia: Secondary | ICD-10-CM | POA: Diagnosis not present

## 2018-07-18 DIAGNOSIS — Z0000 Encounter for general adult medical examination without abnormal findings: Secondary | ICD-10-CM

## 2018-07-18 DIAGNOSIS — I10 Essential (primary) hypertension: Secondary | ICD-10-CM | POA: Diagnosis not present

## 2018-07-18 DIAGNOSIS — N401 Enlarged prostate with lower urinary tract symptoms: Secondary | ICD-10-CM

## 2018-07-18 DIAGNOSIS — E559 Vitamin D deficiency, unspecified: Secondary | ICD-10-CM

## 2018-07-18 DIAGNOSIS — G8929 Other chronic pain: Secondary | ICD-10-CM

## 2018-07-18 DIAGNOSIS — M25562 Pain in left knee: Secondary | ICD-10-CM

## 2018-07-18 NOTE — Patient Instructions (Addendum)
Vit D 2000 units over the counter daily Aleve over the counter once daily  Health Maintenance, Male A healthy lifestyle and preventive care is important for your health and wellness. Ask your health care provider about what schedule of regular examinations is right for you. What should I know about weight and diet? Eat a Healthy Diet  Eat plenty of vegetables, fruits, whole grains, low-fat dairy products, and lean protein.  Do not eat a lot of foods high in solid fats, added sugars, or salt.  Maintain a Healthy Weight Regular exercise can help you achieve or maintain a healthy weight. You should:  Do at least 150 minutes of exercise each week. The exercise should increase your heart rate and make you sweat (moderate-intensity exercise).  Do strength-training exercises at least twice a week.  Watch Your Levels of Cholesterol and Blood Lipids  Have your blood tested for lipids and cholesterol every 5 years starting at 61 years of age. If you are at high risk for heart disease, you should start having your blood tested when you are 61 years old. You may need to have your cholesterol levels checked more often if: ? Your lipid or cholesterol levels are high. ? You are older than 61 years of age. ? You are at high risk for heart disease.  What should I know about cancer screening? Many types of cancers can be detected early and may often be prevented. Lung Cancer  You should be screened every year for lung cancer if: ? You are a current smoker who has smoked for at least 30 years. ? You are a former smoker who has quit within the past 15 years.  Talk to your health care provider about your screening options, when you should start screening, and how often you should be screened.  Colorectal Cancer  Routine colorectal cancer screening usually begins at 61 years of age and should be repeated every 5-10 years until you are 61 years old. You may need to be screened more often if early  forms of precancerous polyps or small growths are found. Your health care provider may recommend screening at an earlier age if you have risk factors for colon cancer.  Your health care provider may recommend using home test kits to check for hidden blood in the stool.  A small camera at the end of a tube can be used to examine your colon (sigmoidoscopy or colonoscopy). This checks for the earliest forms of colorectal cancer.  Prostate and Testicular Cancer  Depending on your age and overall health, your health care provider may do certain tests to screen for prostate and testicular cancer.  Talk to your health care provider about any symptoms or concerns you have about testicular or prostate cancer.  Skin Cancer  Check your skin from head to toe regularly.  Tell your health care provider about any new moles or changes in moles, especially if: ? There is a change in a mole's size, shape, or color. ? You have a mole that is larger than a pencil eraser.  Always use sunscreen. Apply sunscreen liberally and repeat throughout the day.  Protect yourself by wearing long sleeves, pants, a wide-brimmed hat, and sunglasses when outside.  What should I know about heart disease, diabetes, and high blood pressure?  If you are 66-71 years of age, have your blood pressure checked every 3-5 years. If you are 44 years of age or older, have your blood pressure checked every year. You should have your  blood pressure measured twice-once when you are at a hospital or clinic, and once when you are not at a hospital or clinic. Record the average of the two measurements. To check your blood pressure when you are not at a hospital or clinic, you can use: ? An automated blood pressure machine at a pharmacy. ? A home blood pressure monitor.  Talk to your health care provider about your target blood pressure.  If you are between 69-52 years old, ask your health care provider if you should take aspirin to prevent  heart disease.  Have regular diabetes screenings by checking your fasting blood sugar level. ? If you are at a normal weight and have a low risk for diabetes, have this test once every three years after the age of 62. ? If you are overweight and have a high risk for diabetes, consider being tested at a younger age or more often.  A one-time screening for abdominal aortic aneurysm (AAA) by ultrasound is recommended for men aged 51-75 years who are current or former smokers. What should I know about preventing infection? Hepatitis B If you have a higher risk for hepatitis B, you should be screened for this virus. Talk with your health care provider to find out if you are at risk for hepatitis B infection. Hepatitis C Blood testing is recommended for:  Everyone born from [birth year] through 1964.  Anyone with known risk factors for hepatitis C.  Sexually Transmitted Diseases (STDs)  You should be screened each year for STDs including gonorrhea and chlamydia if: ? You are sexually active and are younger than 61 years of age. ? You are older than 61 years of age and your health care provider tells you that you are at risk for this type of infection. ? Your sexual activity has changed since you were last screened and you are at an increased risk for chlamydia or gonorrhea. Ask your health care provider if you are at risk.  Talk with your health care provider about whether you are at high risk of being infected with HIV. Your health care provider may recommend a prescription medicine to help prevent HIV infection.  What else can I do?  Schedule regular health, dental, and eye exams.  Stay current with your vaccines (immunizations).  Do not use any tobacco products, such as cigarettes, chewing tobacco, and e-cigarettes. If you need help quitting, ask your health care provider.  Limit alcohol intake to no more than 2 drinks per day. One drink equals 12 ounces of beer, 5 ounces of wine, or 1 ounces  of hard liquor.  Do not use street drugs.  Do not share needles.  Ask your health care provider for help if you need support or information about quitting drugs.  Tell your health care provider if you often feel depressed.  Tell your health care provider if you have ever been abused or do not feel safe at home. This information is not intended to replace advice given to you by your health care provider. Make sure you discuss any questions you have with your health care provider. Document Released: 03/26/2008 Document Revised: 05/27/2016 Document Reviewed: 07/02/2015 Elsevier Interactive Patient Education  2018 [employer] for Routine Care of Injuries Many injuries can be cared for using rest, ice, compression, and elevation (RICE therapy). Using RICE therapy can help to lessen pain and swelling. It can help your body to heal. Rest Reduce your normal activities and avoid using the injured part  of your body. You can go back to your normal activities when you feel okay and your doctor says it is okay. Ice Do not put ice on your bare skin.  Put ice in a plastic bag.  Place a towel between your skin and the bag.  Leave the ice on for 20 minutes, 2-3 times a day.  Do this for as long as told by your doctor. Compression Compression means putting pressure on the injured area. This can be done with an elastic bandage. If an elastic bandage has been applied:  Remove and reapply the bandage every 3-4 hours or as told by your doctor.  Make sure the bandage is not wrapped too tight. Wrap the bandage more loosely if part of your body beyond the bandage is blue, swollen, cold, painful, or loses feeling (numb).  See your doctor if the bandage seems to make your problems worse.  Elevation Elevation means keeping the injured area raised. Raise the injured area above your heart or the center of your chest if you can. When should I get help? You should get help if:  You keep having  pain and swelling.  Your symptoms get worse.  Get help right away if: You should get help right away if:  You have sudden bad pain at or below the area of your injury.  You have redness or more swelling around your injury.  You have tingling or numbness at or below the injury that does not go away when you take off the bandage.  This information is not intended to replace advice given to you by your health care provider. Make sure you discuss any questions you have with your health care provider. Document Released: 03/16/2008 Document Revised: 08/25/2016 Document Reviewed: 09/05/2014 Elsevier Interactive Patient Education  2017 [employer].

## 2018-07-18 NOTE — Progress Notes (Signed)
Subjective:    Patient ID: Cody Hernandez, male    DOB: 29-Sep-1956, 62 y.o.   MRN: [id number]  Chief Complaint:  Medical Management of Chronic Issues (hyperlipidemia, hypertension) and Left knee pain   HPI: Cody Hernandez is a 62 y.o. male presenting on 07/18/2018 for Medical Management of Chronic Issues (hyperlipidemia, hypertension) and Left knee pain   1. Annual physical exam  Pt presents today for his annual physical exam. Pt states overall he is doing well. States he was released by his oncologist and is not due for his colonoscopy until next year. Denies changes in bowel habits or eating habits. He does wear a CPAP and is tolerating well, denies excessive daytime fatigue and drowsiness.    2. Essential hypertension  Denies headaches, chest pain, or shortness of breath. He is compliant with medications and is tolerating them well. Does not monitor blood pressure at home. Does not watch his diet as he travels all the time for work. Has an eye exam scheduled this morning. Denied blurred vision or visual changes. Does not exercise on a regular basis.    3. Benign prostatic hyperplasia with lower urinary tract symptoms, symptom details unspecified  States he continues to have hesitancy and nocturia. He reports he only has to get up once per night to void but has a hard time starting his stream. States this has been ongoing for years. Denies changes in symptoms or hematuria. Pt would like to check his PSA today.    4. Mixed hyperlipidemia  Pt does not watch his diet and he does not exercise. He is compliant with medications and denies side effects.   5. Vitamin D deficiency  Pt states he does not take his vitamin D as prescribed. States he has never taken it on a regular basis. He does feel fatigued.    6. Chronic pain of left knee  Pt reports ongoing left knee pain since July. States this is worse with bending and leaning over. States it is better with rest. Denies catching or popping in  the knee. Denies swelling, injury, or loss of function. He states he does not feel as if his knee gives way. He has taken a Goody powder on occasion with relief of symptoms. He states the pain is dull and achy, 6/10 at worst. He has not tried ice, compression or elevation.       Relevant past medical, surgical, family, and social history reviewed and updated as indicated.  Allergies and medications reviewed and updated.   Past Medical History:  Diagnosis Date  . Blood clotting disorder ([place])    DVT/PE x2 Coumadin x 2 years 2006  . Colon cancer ([place])   . High cholesterol   . Hypertension   . Pulmonary embolism ([place])   . Sleep apnea     Past Surgical History:  Procedure Laterality Date  . APPENDECTOMY  1977  . COLON SURGERY  2007  . neck/back surgery  1995 and 2010    Social History   Socioeconomic History  . Marital status: Married    Spouse name: Not on file  . Number of children: 0  . Years of education: Not on file  . Highest education level: Not on file  Occupational History    Employer: [employer]  . Financial resource strain: Not on file  . Food insecurity:    Worry: Not on file    Inability: Not on file  . Transportation needs:  Medical: Not on file    Non-medical: Not on file  Tobacco Use  . Smoking status: Former Smoker    Packs/day: 1.00    Years: 20.00    Pack years: 20.00    Types: Cigarettes    Last attempt to quit: 10/12/1992    Years since quitting: 25.7  . Smokeless tobacco: Never Used  Substance and Sexual Activity  . Alcohol use: Not Currently  . Drug use: No  . Sexual activity: Not on file  Lifestyle  . Physical activity:    Days per week: Not on file    Minutes per session: Not on file  . Stress: Not on file  Relationships  . Social connections:    Talks on phone: Not on file    Gets together: Not on file    Attends religious service: Not on file    Active member of club or organization: Not on file    Attends meetings of  clubs or organizations: Not on file    Relationship status: Not on file  . Intimate partner violence:    Fear of current or ex partner: Not on file    Emotionally abused: Not on file    Physically abused: Not on file    Forced sexual activity: Not on file  Other Topics Concern  . Not on file  Social History Narrative  . Not on file    Outpatient Encounter Medications as of 07/18/2018  Medication Sig  . amLODipine (NORVASC) 5 MG tablet Take 1 tablet (5 mg total) by mouth daily.  Marland Kitchen aspirin 81 MG tablet Take 81 mg by mouth daily.    Marland Kitchen atorvastatin (LIPITOR) 40 MG tablet Take 1 tablet (40 mg total) by mouth daily.   No facility-administered encounter medications on file as of 07/18/2018.     Allergies  Allergen Reactions  . Benadryl [Diphenhydramine Hcl]     Nerves "crawling" feelings  . Latex Other (See Comments)    Blisters    Review of Systems  Constitutional: Positive for fatigue. Negative for activity change, appetite change, chills, diaphoresis and fever.  Eyes: Negative for photophobia and visual disturbance.  Respiratory: Negative for cough, chest tightness and shortness of breath.   Cardiovascular: Negative for chest pain, palpitations and leg swelling.  Gastrointestinal: Negative for abdominal distention, abdominal pain, blood in stool, constipation, diarrhea, nausea, rectal pain and vomiting.  Genitourinary: Positive for difficulty urinating. Negative for decreased urine volume, discharge, dysuria, enuresis, flank pain, frequency, genital sores, hematuria, penile pain, penile swelling, scrotal swelling, testicular pain and urgency.  Musculoskeletal: Positive for arthralgias (left knee). Negative for back pain, gait problem, joint swelling, myalgias, neck pain and neck stiffness.  Neurological: Negative for dizziness, seizures, facial asymmetry, weakness, light-headedness, numbness and headaches.  Psychiatric/Behavioral: Negative for sleep disturbance.  All other systems  reviewed and are negative.       Objective:    BP 108/77   Pulse 68   Temp 98.1 F (36.7 C) (Oral)   Ht _0  (1.727 m)   Wt 178 lb (80.7 kg)   BMI 27.06 kg/m    Wt Readings from Last 3 Encounters:  07/18/18 178 lb (80.7 kg)  04/20/18 187 lb 12.8 oz (85.2 kg)  02/26/18 190 lb (86.2 kg)    Physical Exam  Constitutional: He is oriented to person, place, and time. Vital signs are normal. He appears well-developed and well-nourished. He is cooperative.  HENT:  Head: Normocephalic and atraumatic.  Right Ear: Hearing, tympanic membrane, external  ear and ear canal normal.  Left Ear: Hearing, tympanic membrane, external ear and ear canal normal.  Nose: Nose normal.  Mouth/Throat: Uvula is midline, oropharynx is clear and moist and mucous membranes are normal.  Eyes: Pupils are equal, round, and reactive to light. Conjunctivae, EOM and lids are normal.  Neck: Trachea normal, normal range of motion and phonation normal. Neck supple. Normal carotid pulses and no JVD present. Carotid bruit is not present. No thyroid mass and no thyromegaly present.  Cardiovascular: Normal rate, regular rhythm, normal heart sounds, intact distal pulses and normal pulses. Exam reveals no gallop and no friction rub.  No murmur heard. Pulmonary/Chest: Effort normal and breath sounds normal.  Abdominal: Soft. Normal appearance, normal aorta and bowel sounds are normal. There is no tenderness.  Musculoskeletal: Normal range of motion.       Left knee: He exhibits normal range of motion, no swelling, no effusion, no ecchymosis, no deformity, normal alignment, no LCL laxity, normal patellar mobility, no bony tenderness, normal meniscus and no MCL laxity. Tenderness found. LCL tenderness noted. No medial joint line, no lateral joint line, no MCL and no patellar tendon tenderness noted.  Lymphadenopathy:    He has no cervical adenopathy.  Neurological: He is alert and oriented to person, place, and time. He has  normal strength and normal reflexes. He displays normal reflexes. No cranial nerve deficit or sensory deficit. He exhibits normal muscle tone. Coordination normal. GCS eye subscore is 4. GCS verbal subscore is 5. GCS motor subscore is 6.  Skin: Skin is warm and dry. Capillary refill takes less than 2 seconds.  Psychiatric: He has a normal mood and affect. His speech is normal and behavior is normal. Judgment and thought content normal. Cognition and memory are normal.  Nursing note and vitals reviewed.   Results for orders placed or performed during the hospital encounter of 02/26/18  Comprehensive metabolic panel  Result Value Ref Range   Sodium 133 (L) 135 - 145 mmol/L   Potassium 3.1 (L) 3.5 - 5.1 mmol/L   Chloride 101 101 - 111 mmol/L   CO2 21 (L) 22 - 32 mmol/L   Glucose, Bld 127 (H) 65 - 99 mg/dL   BUN 14 6 - 20 mg/dL   Creatinine, Ser 0.97 0.61 - 1.24 mg/dL   Calcium 9.0 8.9 - 10.3 mg/dL   Total Protein 7.5 6.5 - 8.1 g/dL   Albumin 4.1 3.5 - 5.0 g/dL   AST 28 15 - 41 U/L   ALT 32 17 - 63 U/L   Alkaline Phosphatase 66 38 - 126 U/L   Total Bilirubin 1.3 (H) 0.3 - 1.2 mg/dL   GFR calc non Af Amer >60 >60 mL/min   GFR calc Af Amer >60 >60 mL/min   Anion gap 11 5 - 15  Lipase, blood  Result Value Ref Range   Lipase 83 (H) 11 - 51 U/L  Troponin I  Result Value Ref Range   Troponin I <0.03 <0.03 ng/mL  CBC with Differential  Result Value Ref Range   WBC 8.0 4.0 - 10.5 K/uL   RBC 5.23 4.22 - 5.81 MIL/uL   Hemoglobin 15.7 13.0 - 17.0 g/dL   HCT 44.6 39.0 - 52.0 %   MCV 85.3 78.0 - 100.0 fL   MCH 30.0 26.0 - 34.0 pg   MCHC 35.2 30.0 - 36.0 g/dL   RDW 13.2 11.5 - 15.5 %   Platelets 239 150 - 400 K/uL   Neutrophils Relative %  91 %   Neutro Abs 7.2 1.7 - 7.7 K/uL   Lymphocytes Relative 6 %   Lymphs Abs 0.5 (L) 0.7 - 4.0 K/uL   Monocytes Relative 3 %   Monocytes Absolute 0.3 0.1 - 1.0 K/uL   Eosinophils Relative 0 %   Eosinophils Absolute 0.0 0.0 - 0.7 K/uL   Basophils  Relative 0 %   Basophils Absolute 0.0 0.0 - 0.1 K/uL  Urinalysis, Routine w reflex microscopic  Result Value Ref Range   Color, Urine YELLOW YELLOW   APPearance CLEAR CLEAR   Specific Gravity, Urine >1.046 (H) 1.005 - 1.030   pH 5.0 5.0 - 8.0   Glucose, UA NEGATIVE NEGATIVE mg/dL   Hgb urine dipstick NEGATIVE NEGATIVE   Bilirubin Urine NEGATIVE NEGATIVE   Ketones, ur NEGATIVE NEGATIVE mg/dL   Protein, ur NEGATIVE NEGATIVE mg/dL   Nitrite NEGATIVE NEGATIVE   Leukocytes, UA NEGATIVE NEGATIVE  Magnesium  Result Value Ref Range   Magnesium 1.7 1.7 - 2.4 mg/dL       Pertinent labs & imaging results that were available during my care of the patient were reviewed by me and considered in my medical decision making.  Assessment & Plan:  [place] was seen today for medical management of chronic issues and left knee pain.  Diagnoses and all orders for this visit:  Annual physical exam -     CMP14+EGFR -     Lipid panel -     TSH -     PSA, total and free -     CBC with Differential/Platelet -     VITAMIN D 25 Hydroxy (Vit-D Deficiency, Fractures) -     Microalbumin / creatinine urine ratio  Essential hypertension -     CMP14+EGFR -     TSH -     CBC with Differential/Platelet -     Microalbumin / creatinine urine ratio  Benign prostatic hyperplasia with lower urinary tract symptoms, symptom details unspecified -     PSA, total and free  Mixed hyperlipidemia -     CMP14+EGFR -     Lipid panel  Vitamin D deficiency -     VITAMIN D 25 Hydroxy (Vit-D Deficiency, Fractures) Vitamin D 2000 units over the counter daily  Chronic pain of left knee Conservative measures for 4-6 weeks to see if beneficial.  Aleve over the counter, one tablet daily. Will reevaluate in 4-6 weeks to determine efficacy. RICE.     Labs Pending. Continue all other maintenance medications.  Follow up plan: Return in about 6 months (around 01/17/2019), or if symptoms worsen or fail to  improve.  Educational handout given for Health Maintenance, RICE  The above assessment and management plan was discussed with the patient. The patient verbalized understanding of and has agreed to the management plan. Patient is aware to call the clinic if symptoms persist or worsen. Patient is aware when to return to the clinic for a follow-up visit. Patient educated on when it is appropriate to go to the emergency department.   Monia Pouch, FNP-C [facility] [phone]

## 2018-07-19 LAB — CBC WITH DIFFERENTIAL/PLATELET
Basophils Absolute: 0 10*3/uL (ref 0.0–0.2)
Basos: 1 %
EOS (ABSOLUTE): 0.1 10*3/uL (ref 0.0–0.4)
Eos: 2 %
Hematocrit: 42.4 % (ref 37.5–51.0)
Hemoglobin: 14.3 g/dL (ref 13.0–17.7)
Immature Grans (Abs): 0 10*3/uL (ref 0.0–0.1)
Immature Granulocytes: 0 %
Lymphocytes Absolute: 1.4 10*3/uL (ref 0.7–3.1)
Lymphs: 29 %
MCH: 28.9 pg (ref 26.6–33.0)
MCHC: 33.7 g/dL (ref 31.5–35.7)
MCV: 86 fL (ref 79–97)
Monocytes Absolute: 0.4 10*3/uL (ref 0.1–0.9)
Monocytes: 8 %
Neutrophils Absolute: 3 10*3/uL (ref 1.4–7.0)
Neutrophils: 60 %
Platelets: 272 10*3/uL (ref 150–450)
RBC: 4.94 x10E6/uL (ref 4.14–5.80)
RDW: 13.1 % (ref 12.3–15.4)
WBC: 4.9 10*3/uL (ref 3.4–10.8)

## 2018-07-19 LAB — CMP14+EGFR
ALT: 29 IU/L (ref 0–44)
AST: 24 IU/L (ref 0–40)
Albumin/Globulin Ratio: 1.9 (ref 1.2–2.2)
Albumin: 4.5 g/dL (ref 3.6–4.8)
Alkaline Phosphatase: 98 IU/L (ref 39–117)
BUN/Creatinine Ratio: 12 (ref 10–24)
BUN: 12 mg/dL (ref 8–27)
Bilirubin Total: 0.6 mg/dL (ref 0.0–1.2)
CO2: 27 mmol/L (ref 20–29)
Calcium: 9.6 mg/dL (ref 8.6–10.2)
Chloride: 101 mmol/L (ref 96–106)
Creatinine, Ser: 1.01 mg/dL (ref 0.76–1.27)
GFR calc Af Amer: 92 mL/min/{1.73_m2} (ref >59)
GFR calc non Af Amer: 80 mL/min/{1.73_m2} (ref >59)
Globulin, Total: 2.4 g/dL (ref 1.5–4.5)
Glucose: 92 mg/dL (ref 65–99)
Potassium: 4.3 mmol/L (ref 3.5–5.2)
Sodium: 140 mmol/L (ref 134–144)
Total Protein: 6.9 g/dL (ref 6.0–8.5)

## 2018-07-19 LAB — PSA, TOTAL AND FREE
PSA, Free Pct: 15.4 %
PSA, Free: 0.2 ng/mL
Prostate Specific Ag, Serum: 1.3 ng/mL (ref 0.0–4.0)

## 2018-07-19 LAB — LIPID PANEL
CHOL/HDL RATIO: 3.5 ratio (ref 0.0–5.0)
Cholesterol, Total: 127 mg/dL (ref 100–199)
HDL: 36 mg/dL — AB (ref >39)
LDL CALC: 73 mg/dL (ref 0–99)
Triglycerides: 88 mg/dL (ref 0–149)
VLDL CHOLESTEROL CAL: 18 mg/dL (ref 5–40)

## 2018-07-19 LAB — VITAMIN D 25 HYDROXY (VIT D DEFICIENCY, FRACTURES): Vit D, 25-Hydroxy: 34.7 ng/mL (ref 30.0–100.0)

## 2018-07-19 LAB — TSH: TSH: 1.09 u[IU]/mL (ref 0.450–4.500)

## 2018-07-25 DIAGNOSIS — M25562 Pain in left knee: Secondary | ICD-10-CM | POA: Diagnosis not present

## 2018-07-31 DIAGNOSIS — D1721 Benign lipomatous neoplasm of skin and subcutaneous tissue of right arm: Secondary | ICD-10-CM | POA: Diagnosis not present

## 2018-07-31 DIAGNOSIS — M23632 Other spontaneous disruption of medial collateral ligament of left knee: Secondary | ICD-10-CM | POA: Diagnosis not present

## 2018-07-31 DIAGNOSIS — M2242 Chondromalacia patellae, left knee: Secondary | ICD-10-CM | POA: Diagnosis not present

## 2018-08-09 DIAGNOSIS — M25562 Pain in left knee: Secondary | ICD-10-CM | POA: Diagnosis not present

## 2018-09-16 DIAGNOSIS — L309 Dermatitis, unspecified: Secondary | ICD-10-CM | POA: Diagnosis not present

## 2018-09-16 DIAGNOSIS — R234 Changes in skin texture: Secondary | ICD-10-CM | POA: Diagnosis not present

## 2018-10-13 ENCOUNTER — Ambulatory Visit: Payer: 59 | Admitting: Family Medicine

## 2018-10-14 ENCOUNTER — Ambulatory Visit (INDEPENDENT_AMBULATORY_CARE_PROVIDER_SITE_OTHER): Payer: 59 | Admitting: Family Medicine

## 2018-10-14 ENCOUNTER — Encounter: Payer: Self-pay | Admitting: Family Medicine

## 2018-10-14 VITALS — BP 121/79 | HR 61 | Temp 97.4°F | Ht 68.0 in | Wt 180.0 lb

## 2018-10-14 DIAGNOSIS — I10 Essential (primary) hypertension: Secondary | ICD-10-CM | POA: Diagnosis not present

## 2018-10-14 DIAGNOSIS — L209 Atopic dermatitis, unspecified: Secondary | ICD-10-CM | POA: Diagnosis not present

## 2018-10-14 DIAGNOSIS — M25562 Pain in left knee: Secondary | ICD-10-CM

## 2018-10-14 DIAGNOSIS — E782 Mixed hyperlipidemia: Secondary | ICD-10-CM

## 2018-10-14 DIAGNOSIS — G8929 Other chronic pain: Secondary | ICD-10-CM

## 2018-10-14 LAB — CBC WITH DIFFERENTIAL/PLATELET
Basophils Absolute: 0.1 10*3/uL (ref 0.0–0.2)
Basos: 1 %
EOS (ABSOLUTE): 0.1 10*3/uL (ref 0.0–0.4)
Eos: 1 %
HEMATOCRIT: 43.5 % (ref 37.5–51.0)
Hemoglobin: 14.9 g/dL (ref 13.0–17.7)
Immature Grans (Abs): 0 10*3/uL (ref 0.0–0.1)
Immature Granulocytes: 0 %
Lymphocytes Absolute: 2.2 10*3/uL (ref 0.7–3.1)
Lymphs: 30 %
MCH: 30 pg (ref 26.6–33.0)
MCHC: 34.3 g/dL (ref 31.5–35.7)
MCV: 88 fL (ref 79–97)
MONOCYTES: 8 %
Monocytes Absolute: 0.6 10*3/uL (ref 0.1–0.9)
Neutrophils Absolute: 4.4 10*3/uL (ref 1.4–7.0)
Neutrophils: 60 %
Platelets: 311 10*3/uL (ref 150–450)
RBC: 4.96 x10E6/uL (ref 4.14–5.80)
RDW: 12.8 % (ref 12.3–15.4)
WBC: 7.3 10*3/uL (ref 3.4–10.8)

## 2018-10-14 LAB — CMP14+EGFR
ALT: 28 IU/L (ref 0–44)
AST: 24 IU/L (ref 0–40)
Albumin/Globulin Ratio: 1.7 (ref 1.2–2.2)
Albumin: 4.3 g/dL (ref 3.6–4.8)
Alkaline Phosphatase: 91 IU/L (ref 39–117)
BUN/Creatinine Ratio: 14 (ref 10–24)
BUN: 12 mg/dL (ref 8–27)
Bilirubin Total: 0.5 mg/dL (ref 0.0–1.2)
CO2: 24 mmol/L (ref 20–29)
CREATININE: 0.86 mg/dL (ref 0.76–1.27)
Calcium: 9.5 mg/dL (ref 8.6–10.2)
Chloride: 101 mmol/L (ref 96–106)
GFR calc Af Amer: 108 mL/min/{1.73_m2} (ref >59)
GFR calc non Af Amer: 94 mL/min/{1.73_m2} (ref >59)
GLUCOSE: 96 mg/dL (ref 65–99)
Globulin, Total: 2.6 g/dL (ref 1.5–4.5)
Potassium: 4.1 mmol/L (ref 3.5–5.2)
Sodium: 139 mmol/L (ref 134–144)
Total Protein: 6.9 g/dL (ref 6.0–8.5)

## 2018-10-14 LAB — LIPID PANEL
Chol/HDL Ratio: 4.1 ratio (ref 0.0–5.0)
Cholesterol, Total: 172 mg/dL (ref 100–199)
HDL: 42 mg/dL (ref >39)
LDL Calculated: 106 mg/dL — ABNORMAL HIGH (ref 0–99)
Triglycerides: 119 mg/dL (ref 0–149)
VLDL CHOLESTEROL CAL: 24 mg/dL (ref 5–40)

## 2018-10-14 MED ORDER — ATORVASTATIN CALCIUM 40 MG PO TABS: 40.0000 mg | ORAL_TABLET | Freq: Every day | ORAL | 1 refills | Status: DC

## 2018-10-14 MED ORDER — DICLOFENAC SODIUM 1 % TD GEL: 2.0000 g | Freq: Four times a day (QID) | TRANSDERMAL | 1 refills | Status: DC

## 2018-10-14 MED ORDER — AMLODIPINE BESYLATE 5 MG PO TABS: 5.0000 mg | ORAL_TABLET | Freq: Every day | ORAL | 1 refills | Status: DC

## 2018-10-14 MED ORDER — TRIAMCINOLONE ACETONIDE 0.1 % EX CREA: 1.0000 "application " | TOPICAL_CREAM | Freq: Two times a day (BID) | CUTANEOUS | 0 refills | Status: DC

## 2018-10-14 NOTE — Patient Instructions (Addendum)
Acute Knee Pain, Adult Many things can cause knee pain. Sometimes, knee pain is sudden (acute) and may be caused by damage, swelling, or irritation of the muscles and tissues that support your knee. The pain often goes away on its own with time and rest. If the pain does not go away, tests may be done to find out what is causing the pain. Follow these instructions at home: Pay attention to any changes in your symptoms. Take these actions to relieve your pain. If you have a knee sleeve or brace:   Wear the sleeve or brace as told by your doctor. Remove it only as told by your doctor.  Loosen the sleeve or brace if your toes: ? Tingle. ? Become numb. ? Turn cold and blue.  Keep the sleeve or brace clean.  If the sleeve or brace is not waterproof: ? Do not let it get wet. ? Cover it with a watertight covering when you take a bath or shower. Activity  Rest your knee.  Do not do things that cause pain.  Avoid activities where both feet leave the ground at the same time (high-impact activities). Examples are running, jumping rope, and doing jumping jacks.  Work with a physical therapist to make a safe exercise program, as told by your doctor. Managing pain, stiffness, and swelling   If told, put ice on the knee: ? Put ice in a plastic bag. ? Place a towel between your skin and the bag. ? Leave the ice on for 20 minutes, 2-3 times a day.  If told, put pressure (compression) on your injured knee to control swelling, give support, and help with discomfort. Compression may be done with an elastic bandage. General instructions  Take all medicines only as told by your doctor.  Raise (elevate) your knee while you are sitting or lying down. Make sure your knee is higher than your heart.  Sleep with a pillow under your knee.  Do not use any products that contain nicotine or tobacco. These include cigarettes, e-cigarettes, and chewing tobacco. These products may slow down healing. If  you need help quitting, ask your doctor.  If you are overweight, work with your doctor and a food expert (dietitian) to set goals to lose weight. Being overweight can make your knee hurt more.  Keep all follow-up visits as told by your doctor. This is important. Contact a doctor if:  The knee pain does not stop.  The knee pain changes or gets worse.  You have a fever along with knee pain.  Your knee feels warm when you touch it.  Your knee gives out or locks up. Get help right away if:  Your knee swells, and the swelling gets worse.  You cannot move your knee.  You have very bad knee pain. Summary  Many things can cause knee pain. The pain often goes away on its own with time and rest.  Your doctor may do tests to find out the cause of the pain.  Pay attention to any changes in your symptoms. Relieve your pain with rest, medicines, light activity, and use of ice.  Get help right away if you cannot move your knee or your knee pain is very bad. This information is not intended to replace advice given to you by your health care provider. Make sure you discuss any questions you have with your health care provider. Document Released: 12/25/2008 Document Revised: 03/10/2018 Document Reviewed: 03/10/2018 Elsevier Interactive Patient Education  2019 [employer]    Atopic Dermatitis Atopic dermatitis is a skin disorder that causes inflammation of the skin. This is the most common type of eczema. Eczema is a group of skin conditions that cause the skin to be itchy, red, and swollen. This condition is generally worse during the cooler winter months and often improves during the warm summer months. Symptoms can vary from person to person. Atopic dermatitis usually starts showing signs in infancy and can last through adulthood. This condition cannot be passed from one person to another (non-contagious), but it is more common in families. Atopic dermatitis may not always be present. When it  is present, it is called a flare-up. What are the causes? The exact cause of this condition is not known. Flare-ups of the condition may be triggered by:  Contact with something that you are sensitive or allergic to.  Stress.  Certain foods.  Extremely hot or cold weather.  Harsh chemicals and soaps.  Dry air.  Chlorine. What increases the risk? This condition is more likely to develop in people who have a personal history or family history of eczema, allergies, asthma, or hay fever. What are the signs or symptoms? Symptoms of this condition include:  Dry, scaly skin.  Red, itchy rash.  Itchiness, which can be severe. This may occur before the skin rash. This can make sleeping difficult.  Skin thickening and cracking that can occur over time. How is this diagnosed? This condition is diagnosed based on your symptoms, a medical history, and a physical exam. How is this treated? There is no cure for this condition, but symptoms can usually be controlled. Treatment focuses on:  Controlling the itchiness and scratching. You may be given medicines, such as antihistamines or steroid creams.  Limiting exposure to things that you are sensitive or allergic to (allergens).  Recognizing situations that cause stress and developing a plan to manage stress. If your atopic dermatitis does not get better with medicines, or if it is all over your body (widespread), a treatment using a specific type of light (phototherapy) may be used. Follow these instructions at home: Skin care   Keep your skin well-moisturized. Doing this seals in moisture and helps to prevent dryness. ? Use unscented lotions that have petroleum in them. ? Avoid lotions that contain alcohol or water. They can dry the skin.  Keep baths or showers short (less than 5 minutes) in warm water. Do not use hot water. ? Use mild, unscented cleansers for bathing. Avoid soap and bubble bath. ? Apply a moisturizer to your skin  right after a bath or shower.  Do not apply anything to your skin without checking with your health care provider. General instructions  Dress in clothes made of cotton or cotton blends. Dress lightly because heat increases itchiness.  When washing your clothes, rinse your clothes twice so all of the soap is removed.  Avoid any triggers that can cause a flare-up.  Try to manage your stress.  Keep your fingernails cut short.  Avoid scratching. Scratching makes the rash and itchiness worse. It may also result in a skin infection (impetigo) due to a break in the skin caused by scratching.  Take or apply over-the-counter and prescription medicines only as told by your health care provider.  Keep all follow-up visits as told by your health care provider. This is important.  Do not be around people who have cold sores or fever blisters. If you get the infection, it may cause your atopic dermatitis to worsen. Contact a  health care provider if:  Your itchiness interferes with sleep.  Your rash gets worse or it is not better within one week of starting treatment.  You have a fever.  You have a rash flare-up after having contact with someone who has cold sores or fever blisters. Get help right away if:  You develop pus or soft yellow scabs in the rash area. Summary  This condition causes a red rash and itchy, dry, scaly skin.  Treatment focuses on controlling the itchiness and scratching, limiting exposure to things that you are sensitive or allergic to (allergens), recognizing situations that cause stress, and developing a plan to manage stress.  Keep your skin well-moisturized.  Keep baths or showers shorter than 5 minutes and use warm water. Do not use hot water. This information is not intended to replace advice given to you by your health care provider. Make sure you discuss any questions you have with your health care provider. Document Released: 09/25/2000 Document Revised:  10/30/2016 Document Reviewed: 10/30/2016 Elsevier Interactive Patient Education  2019 [employer].

## 2018-10-14 NOTE — Progress Notes (Signed)
Subjective:    Patient ID: Cody Hernandez, male    DOB: [birth year], 63 y.o.   MRN: [id number]  Chief Complaint:  Lesion on back (dermatology referral if needed) and Left knee pain   HPI: Cody Hernandez is a 63 y.o. male presenting on 10/14/2018 for Lesion on back (dermatology referral if needed) and Left knee pain   1. Chronic pain of left knee  Pt states he has ongoing left knee pain. States he has been working in [place] and saw an orthopedic there who did a MRI. Pt is unsure of the MRI results and no results could be located in Epic. Records requested. Pt states the pain is tolerable but worse with exertion. States his knee pops at times. He has tried ice and heat without relief of symptoms.    2. Atopic dermatitis, mild  Pt reports a small pruritic area to his back and around his nasal folds. Pt states this rash is intermittent for several months. Denies drainage or pain. No fever or chills. States he has tried over the counter hydrocortisone without relief of symptoms.    3. Essential hypertension  Complaint with meds - Yes Checking BP at home - No Exercising Regularly - No Watching Salt intake - No Pertinent ROS:  Headache - No Chest pain - No Dyspnea - No Palpitations - No LE edema - No They report good compliance with medications and can restate their regimen by memory. No medication side effects.  BP Readings from Last 3 Encounters:  10/14/18 121/79  07/18/18 108/77  04/20/18 128/76     4. Mixed hyperlipidemia  Does not watch diet or exercise on a regular basis.     Relevant past medical, surgical, family, and social history reviewed and updated as indicated.  Allergies and medications reviewed and updated.   Past Medical History:  Diagnosis Date  . Blood clotting disorder ([place])    DVT/PE x2 Coumadin x 2 years 2006  . Colon cancer ([place])   . High cholesterol   . Hypertension   . Pulmonary embolism ([place])   . Sleep apnea     Past Surgical History:    Procedure Laterality Date  . APPENDECTOMY  1977  . COLON SURGERY  2007  . neck/back surgery  1995 and 2010    Social History   Socioeconomic History  . Marital status: Married    Spouse name: Not on file  . Number of children: 0  . Years of education: Not on file  . Highest education level: Not on file  Occupational History    Employer: [employer]  . Financial resource strain: Not on file  . Food insecurity:    Worry: Not on file    Inability: Not on file  . Transportation needs:    Medical: Not on file    Non-medical: Not on file  Tobacco Use  . Smoking status: Former Smoker    Packs/day: 1.00    Years: 20.00    Pack years: 20.00    Types: Cigarettes    Last attempt to quit: 10/12/1992    Years since quitting: 26.0  . Smokeless tobacco: Never Used  Substance and Sexual Activity  . Alcohol use: Not Currently  . Drug use: No  . Sexual activity: Not on file  Lifestyle  . Physical activity:    Days per week: Not on file    Minutes per session: Not on file  . Stress: Not on file  Relationships  .  Social connections:    Talks on phone: Not on file    Gets together: Not on file    Attends religious service: Not on file    Active member of club or organization: Not on file    Attends meetings of clubs or organizations: Not on file    Relationship status: Not on file  . Intimate partner violence:    Fear of current or ex partner: Not on file    Emotionally abused: Not on file    Physically abused: Not on file    Forced sexual activity: Not on file  Other Topics Concern  . Not on file  Social History Narrative  . Not on file    Outpatient Encounter Medications as of 10/14/2018  Medication Sig  . amLODipine (NORVASC) 5 MG tablet Take 1 tablet (5 mg total) by mouth daily.  Marland Kitchen aspirin 81 MG tablet Take 81 mg by mouth daily.    Marland Kitchen atorvastatin (LIPITOR) 40 MG tablet Take 1 tablet (40 mg total) by mouth daily.  . [DISCONTINUED] amLODipine (NORVASC) 5 MG tablet  Take 1 tablet (5 mg total) by mouth daily.  . [DISCONTINUED] atorvastatin (LIPITOR) 40 MG tablet Take 1 tablet (40 mg total) by mouth daily.  . diclofenac sodium (VOLTAREN) 1 % GEL Apply 2 g topically 4 (four) times daily.  Marland Kitchen triamcinolone cream (KENALOG) 0.1 % Apply 1 application topically 2 (two) times daily.   No facility-administered encounter medications on file as of 10/14/2018.     Allergies  Allergen Reactions  . Benadryl [Diphenhydramine Hcl]     Nerves "crawling" feelings  . Latex Other (See Comments)    Blisters    Review of Systems  Constitutional: Negative for appetite change, chills, fatigue, fever and unexpected weight change.  Respiratory: Negative for cough, choking, chest tightness and shortness of breath.   Cardiovascular: Negative for chest pain, palpitations and leg swelling.  Gastrointestinal: Negative for abdominal pain.  Genitourinary: Negative for decreased urine volume and difficulty urinating.  Musculoskeletal: Positive for arthralgias (left knee) and joint swelling. Negative for back pain, neck pain and neck stiffness.  Skin: Positive for rash.  Neurological: Negative for dizziness, syncope, weakness, numbness and headaches.  Psychiatric/Behavioral: Negative for confusion.  All other systems reviewed and are negative.       Objective:    BP 121/79   Pulse 61   Temp (!) 97.4 F (36.3 C) (Oral)   Ht '5\' 8"'$  (1.727 m)   Wt 180 lb (81.6 kg)   BMI 27.37 kg/m    Wt Readings from Last 3 Encounters:  10/14/18 180 lb (81.6 kg)  07/18/18 178 lb (80.7 kg)  04/20/18 187 lb 12.8 oz (85.2 kg)    Physical Exam Vitals signs and nursing note reviewed.  Constitutional:      General: He is not in acute distress.    Appearance: Normal appearance. He is well-developed, well-groomed and overweight. He is not ill-appearing.  HENT:     Head: Normocephalic and atraumatic.     Jaw: There is normal jaw occlusion.     Right Ear: Hearing, tympanic membrane, ear  canal and external ear normal.     Left Ear: Hearing, tympanic membrane, ear canal and external ear normal.     Nose: Nose normal.     Mouth/Throat:     Lips: Pink.     Mouth: Mucous membranes are moist.     Pharynx: Oropharynx is clear.  Eyes:     General: Lids are normal.  Extraocular Movements: Extraocular movements intact.     Conjunctiva/sclera: Conjunctivae normal.     Pupils: Pupils are equal, round, and reactive to light.  Neck:     Musculoskeletal: Full passive range of motion without pain and neck supple.     Thyroid: No thyroid mass, thyromegaly or thyroid tenderness.     Vascular: No carotid bruit or JVD.  Cardiovascular:     Rate and Rhythm: Normal rate and regular rhythm.     Pulses: Normal pulses.     Heart sounds: Normal heart sounds. No murmur. No friction rub. No gallop.   Pulmonary:     Effort: Pulmonary effort is normal. No respiratory distress.     Breath sounds: Normal breath sounds.  Abdominal:     General: Bowel sounds are normal.     Palpations: Abdomen is soft.  Musculoskeletal: Normal range of motion.     Left hip: Normal.     Left knee: He exhibits swelling. He exhibits normal range of motion, no effusion, no ecchymosis, no deformity, no laceration, no erythema, normal alignment, no LCL laxity, normal patellar mobility, no bony tenderness, normal meniscus and no MCL laxity. Tenderness found.     Left ankle: Normal.       Legs:  Lymphadenopathy:     Cervical: No cervical adenopathy.  Skin:    General: Skin is warm and dry.     Capillary Refill: Capillary refill takes less than 2 seconds.     Findings: Rash present.       Neurological:     General: No focal deficit present.     Mental Status: He is alert and oriented to person, place, and time.     Motor: No weakness.  Psychiatric:        Mood and Affect: Mood normal.        Behavior: Behavior normal. Behavior is cooperative.        Thought Content: Thought content normal.        Judgment:  Judgment normal.     Results for orders placed or performed in visit on 07/18/18  CMP14+EGFR  Result Value Ref Range   Glucose 92 65 - 99 mg/dL   BUN 12 8 - 27 mg/dL   Creatinine, Ser 1.01 0.76 - 1.27 mg/dL   GFR calc non Af Amer 80 >59 mL/min/1.73   GFR calc Af Amer 92 >59 mL/min/1.73   BUN/Creatinine Ratio 12 10 - 24   Sodium 140 134 - 144 mmol/L   Potassium 4.3 3.5 - 5.2 mmol/L   Chloride 101 96 - 106 mmol/L   CO2 27 20 - 29 mmol/L   Calcium 9.6 8.6 - 10.2 mg/dL   Total Protein 6.9 6.0 - 8.5 g/dL   Albumin 4.5 3.6 - 4.8 g/dL   Globulin, Total 2.4 1.5 - 4.5 g/dL   Albumin/Globulin Ratio 1.9 1.2 - 2.2   Bilirubin Total 0.6 0.0 - 1.2 mg/dL   Alkaline Phosphatase 98 39 - 117 IU/L   AST 24 0 - 40 IU/L   ALT 29 0 - 44 IU/L  Lipid panel  Result Value Ref Range   Cholesterol, Total 127 100 - 199 mg/dL   Triglycerides 88 0 - 149 mg/dL   HDL 36 (L) >39 mg/dL   VLDL Cholesterol Cal 18 5 - 40 mg/dL   LDL Calculated 73 0 - 99 mg/dL   Chol/HDL Ratio 3.5 0.0 - 5.0 ratio  TSH  Result Value Ref Range   TSH 1.090 0.450 - 4.500  uIU/mL  PSA, total and free  Result Value Ref Range   Prostate Specific Ag, Serum 1.3 0.0 - 4.0 ng/mL   PSA, Free 0.20 N/A ng/mL   PSA, Free Pct 15.4 %  CBC with Differential/Platelet  Result Value Ref Range   WBC 4.9 3.4 - 10.8 x10E3/uL   RBC 4.94 4.14 - 5.80 x10E6/uL   Hemoglobin 14.3 13.0 - 17.7 g/dL   Hematocrit 42.4 37.5 - 51.0 %   MCV 86 79 - 97 fL   MCH 28.9 26.6 - 33.0 pg   MCHC 33.7 31.5 - 35.7 g/dL   RDW 13.1 12.3 - 15.4 %   Platelets 272 150 - 450 x10E3/uL   Neutrophils 60 Not Estab. %   Lymphs 29 Not Estab. %   Monocytes 8 Not Estab. %   Eos 2 Not Estab. %   Basos 1 Not Estab. %   Neutrophils Absolute 3.0 1.4 - 7.0 x10E3/uL   Lymphocytes Absolute 1.4 0.7 - 3.1 x10E3/uL   Monocytes Absolute 0.4 0.1 - 0.9 x10E3/uL   EOS (ABSOLUTE) 0.1 0.0 - 0.4 x10E3/uL   Basophils Absolute 0.0 0.0 - 0.2 x10E3/uL   Immature Granulocytes 0 Not Estab. %     Immature Grans (Abs) 0.0 0.0 - 0.1 x10E3/uL  VITAMIN D 25 Hydroxy (Vit-D Deficiency, Fractures)  Result Value Ref Range   Vit D, 25-Hydroxy 34.7 30.0 - 100.0 ng/mL        Pertinent labs & imaging results that were available during my care of the patient were reviewed by me and considered in my medical decision making.  Assessment & Plan:  Dearius was seen today for lesion on back and left knee pain.  Diagnoses and all orders for this visit:  Chronic pain of left knee RICE discussed. Declined PT. Declined knee brace. Records from ortho in [place] requested. Medications as prescribed. Referral to orthopedic.  -     diclofenac sodium (VOLTAREN) 1 % GEL; Apply 2 g topically 4 (four) times daily. -     Ambulatory referral to Orthopedic Surgery  Atopic dermatitis, mild Use jar emollients daily. Avoid triggers. Medications as prescribed. Eucrisa sample provided for face.  -     triamcinolone cream (KENALOG) 0.1 %; Apply 1 application topically 2 (two) times daily.  Essential hypertension Diet and exercise encouraged. DASH diet. Medications as prescribed.  -     amLODipine (NORVASC) 5 MG tablet; Take 1 tablet (5 mg total) by mouth daily. -     CBC with Differential/Platelet -     CMP14+EGFR -     Lipid panel -     Microalbumin / creatinine urine ratio  Mixed hyperlipidemia Diet and exercise encouraged. Medications as prescribed.  -     atorvastatin (LIPITOR) 40 MG tablet; Take 1 tablet (40 mg total) by mouth daily. -     Lipid panel   Records requested from providers in [place].   Continue all other maintenance medications.  Follow up plan: Return in about 3 months (around 01/13/2019), or if symptoms worsen or fail to improve.  Educational handout given for atopic dermatitis, acute knee pain  The above assessment and management plan was discussed with the patient. The patient verbalized understanding of and has agreed to the management plan. Patient is aware to call the clinic if  symptoms persist or worsen. Patient is aware when to return to the clinic for a follow-up visit. Patient educated on when it is appropriate to go to the emergency department.   Monia Pouch,  FNP-C [place] [phone]

## 2018-10-15 LAB — MICROALBUMIN / CREATININE URINE RATIO
Creatinine, Urine: 275.9 mg/dL
Microalb/Creat Ratio: 4.6 mg/g creat (ref 0.0–30.0)
Microalbumin, Urine: 12.8 ug/mL

## 2018-10-18 ENCOUNTER — Ambulatory Visit: Payer: 59 | Admitting: Family Medicine

## 2018-10-25 ENCOUNTER — Other Ambulatory Visit: Payer: Self-pay | Admitting: *Deleted

## 2018-10-25 DIAGNOSIS — E782 Mixed hyperlipidemia: Secondary | ICD-10-CM

## 2018-10-25 DIAGNOSIS — I10 Essential (primary) hypertension: Secondary | ICD-10-CM

## 2018-10-25 MED ORDER — ATORVASTATIN CALCIUM 40 MG PO TABS: 40.0000 mg | ORAL_TABLET | Freq: Every day | ORAL | 1 refills | Status: DC

## 2018-10-25 MED ORDER — AMLODIPINE BESYLATE 5 MG PO TABS: 5.0000 mg | ORAL_TABLET | Freq: Every day | ORAL | 1 refills | Status: DC

## 2018-11-09 DIAGNOSIS — G4733 Obstructive sleep apnea (adult) (pediatric): Secondary | ICD-10-CM | POA: Diagnosis not present

## 2019-01-16 ENCOUNTER — Ambulatory Visit (INDEPENDENT_AMBULATORY_CARE_PROVIDER_SITE_OTHER): Payer: 59 | Admitting: Family Medicine

## 2019-01-16 ENCOUNTER — Other Ambulatory Visit: Payer: Self-pay

## 2019-01-16 DIAGNOSIS — I10 Essential (primary) hypertension: Secondary | ICD-10-CM

## 2019-01-16 NOTE — Progress Notes (Signed)
Attempted to call pt at number listed in chart. Ms. Marcello Moores answered the phone and stated this was the wrong number and that she did not have a number to reach the pt.

## 2019-04-12 ENCOUNTER — Other Ambulatory Visit: Payer: Self-pay

## 2019-04-13 ENCOUNTER — Ambulatory Visit (INDEPENDENT_AMBULATORY_CARE_PROVIDER_SITE_OTHER): Payer: 59 | Admitting: Family Medicine

## 2019-04-13 ENCOUNTER — Encounter: Payer: Self-pay | Admitting: Family Medicine

## 2019-04-13 VITALS — BP 110/74 | HR 71 | Temp 97.8°F | Ht 68.0 in | Wt 191.0 lb

## 2019-04-13 DIAGNOSIS — E559 Vitamin D deficiency, unspecified: Secondary | ICD-10-CM | POA: Diagnosis not present

## 2019-04-13 DIAGNOSIS — I10 Essential (primary) hypertension: Secondary | ICD-10-CM

## 2019-04-13 DIAGNOSIS — G4733 Obstructive sleep apnea (adult) (pediatric): Secondary | ICD-10-CM

## 2019-04-13 DIAGNOSIS — E782 Mixed hyperlipidemia: Secondary | ICD-10-CM

## 2019-04-13 DIAGNOSIS — R3911 Hesitancy of micturition: Secondary | ICD-10-CM

## 2019-04-13 DIAGNOSIS — N401 Enlarged prostate with lower urinary tract symptoms: Secondary | ICD-10-CM | POA: Diagnosis not present

## 2019-04-13 MED ORDER — TAMSULOSIN HCL 0.4 MG PO CAPS: 0.4000 mg | ORAL_CAPSULE | Freq: Every day | ORAL | 3 refills | Status: DC

## 2019-04-13 MED ORDER — AMLODIPINE BESYLATE 5 MG PO TABS: 5.0000 mg | ORAL_TABLET | Freq: Every day | ORAL | 1 refills | Status: DC

## 2019-04-13 MED ORDER — ATORVASTATIN CALCIUM 40 MG PO TABS: 40.0000 mg | ORAL_TABLET | Freq: Every day | ORAL | 1 refills | Status: DC

## 2019-04-13 NOTE — Progress Notes (Signed)
Subjective:  Patient ID: Cody Hernandez, male    DOB: 1956-05-31, 63 y.o.   MRN: [id number]  Chief Complaint:  Hyperlipidemia (med check up ) and Hypertension   HPI: Cody Hernandez is a 63 y.o. male presenting on 04/13/2019 for Hyperlipidemia (med check up ) and Hypertension   1. Essential hypertension  Complaint with meds - Yes Checking BP at home - No Exercising Regularly - No Watching Salt intake - Yes Pertinent ROS:  Headache - No Chest pain - No Dyspnea - No Palpitations - No LE edema - No They report good compliance with medications and can restate their regimen by memory. No medication side effects.  BP Readings from Last 3 Encounters:  04/13/19 110/74  10/14/18 121/79  07/18/18 108/77     2. Mixed hyperlipidemia  Does try to watch diet. Is compliant with medications without associated side effects. Is very active at work but does not exercise on a regular basis.    3. Benign prostatic hyperplasia with urinary hesitancy  Ongoing symptoms for a while. States Dr. Wendi Snipes offered him Flomax in the past and he declined because symptoms were manageable. Pt states he now has increased hesitancy and weak stream. No hematuria, rectal pain, scrotal pain or swelling, fever, chills, back pain, or abdominal pain. No constipation.    4. Vitamin D deficiency  On oral repletion therapy. Feeling better since starting this. More energy. No arthralgias or myalgias.   5. Obstructive sleep apnea  Does have sleep apnea. Wears CPAP nightly and is tolerating it well. Denies daytime fatigue. No recent adjustments to CPAP settings.      Relevant past medical, surgical, family, and social history reviewed and updated as indicated.  Allergies and medications reviewed and updated.   Past Medical History:  Diagnosis Date  . Blood clotting disorder ([place])    DVT/PE x2 Coumadin x 2 years 2006  . Colon cancer ([place])   . High cholesterol   . Hypertension   . Pulmonary embolism ([place])   .  Sleep apnea     Past Surgical History:  Procedure Laterality Date  . APPENDECTOMY  1977  . COLON SURGERY  2007  . neck/back surgery  1995 and 2010    Social History   Socioeconomic History  . Marital status: Married    Spouse name: Not on file  . Number of children: 0  . Years of education: Not on file  . Highest education level: Not on file  Occupational History    Employer: [employer]  . Financial resource strain: Not on file  . Food insecurity    Worry: Not on file    Inability: Not on file  . Transportation needs    Medical: Not on file    Non-medical: Not on file  Tobacco Use  . Smoking status: Former Smoker    Packs/day: 1.00    Years: 20.00    Pack years: 20.00    Types: Cigarettes    Quit date: 10/12/1992    Years since quitting: 26.5  . Smokeless tobacco: Never Used  Substance and Sexual Activity  . Alcohol use: Not Currently  . Drug use: No  . Sexual activity: Not on file  Lifestyle  . Physical activity    Days per week: Not on file    Minutes per session: Not on file  . Stress: Not on file  Relationships  . Social connections    Talks on phone: Not on file  Gets together: Not on file    Attends religious service: Not on file    Active member of club or organization: Not on file    Attends meetings of clubs or organizations: Not on file    Relationship status: Not on file  . Intimate partner violence    Fear of current or ex partner: Not on file    Emotionally abused: Not on file    Physically abused: Not on file    Forced sexual activity: Not on file  Other Topics Concern  . Not on file  Social History Narrative  . Not on file    Outpatient Encounter Medications as of 04/13/2019  Medication Sig  . amLODipine (NORVASC) 5 MG tablet Take 1 tablet (5 mg total) by mouth daily.  Marland Kitchen aspirin 81 MG tablet Take 81 mg by mouth daily.    Marland Kitchen atorvastatin (LIPITOR) 40 MG tablet Take 1 tablet (40 mg total) by mouth daily.  . [DISCONTINUED]  amLODipine (NORVASC) 5 MG tablet Take 1 tablet (5 mg total) by mouth daily.  . [DISCONTINUED] atorvastatin (LIPITOR) 40 MG tablet Take 1 tablet (40 mg total) by mouth daily.  . tamsulosin (FLOMAX) 0.4 MG CAPS capsule Take 1 capsule (0.4 mg total) by mouth daily.  . [DISCONTINUED] diclofenac sodium (VOLTAREN) 1 % GEL Apply 2 g topically 4 (four) times daily.  . [DISCONTINUED] triamcinolone cream (KENALOG) 0.1 % Apply 1 application topically 2 (two) times daily.   No facility-administered encounter medications on file as of 04/13/2019.     Allergies  Allergen Reactions  . Benadryl [Diphenhydramine Hcl]     Nerves "crawling" feelings  . Latex Other (See Comments)    Blisters    Review of Systems  Constitutional: Negative for activity change, appetite change, chills, diaphoresis, fatigue, fever and unexpected weight change.  Eyes: Negative for photophobia and visual disturbance.  Respiratory: Negative for cough, chest tightness, shortness of breath and wheezing.   Cardiovascular: Negative for chest pain, palpitations and leg swelling.  Gastrointestinal: Negative for abdominal pain, anal bleeding, blood in stool, constipation, diarrhea, nausea, rectal pain and vomiting.  Endocrine: Negative for cold intolerance, heat intolerance, polydipsia, polyphagia and polyuria.  Genitourinary: Positive for difficulty urinating and frequency. Negative for decreased urine volume, discharge, dysuria, flank pain, genital sores, hematuria, penile pain, penile swelling, scrotal swelling and testicular pain.  Musculoskeletal: Negative for arthralgias, back pain, gait problem, joint swelling and myalgias.  Skin: Negative for color change and pallor.  Neurological: Negative for dizziness, tremors, seizures, syncope, facial asymmetry, speech difficulty, weakness, light-headedness, numbness and headaches.  Hematological: Does not bruise/bleed easily.  Psychiatric/Behavioral: Negative for confusion and sleep  disturbance.  All other systems reviewed and are negative.       Objective:  BP 110/74   Pulse 71   Temp 97.8 F (36.6 C) (Oral)   Ht '5\' 8"'$  (1.727 m)   Wt 191 lb (86.6 kg)   BMI 29.04 kg/m    Wt Readings from Last 3 Encounters:  04/13/19 191 lb (86.6 kg)  10/14/18 180 lb (81.6 kg)  07/18/18 178 lb (80.7 kg)    Physical Exam Vitals signs and nursing note reviewed.  Constitutional:      General: He is not in acute distress.    Appearance: Normal appearance. He is well-developed and well-groomed. He is not ill-appearing, toxic-appearing or diaphoretic.  HENT:     Head: Normocephalic and atraumatic.     Jaw: There is normal jaw occlusion.     Right Ear:  Hearing normal.     Left Ear: Hearing normal.     Nose: Nose normal.     Mouth/Throat:     Lips: Pink.     Mouth: Mucous membranes are moist.     Pharynx: Oropharynx is clear. Uvula midline.  Eyes:     General: Lids are normal.     Extraocular Movements: Extraocular movements intact.     Conjunctiva/sclera: Conjunctivae normal.     Pupils: Pupils are equal, round, and reactive to light.  Neck:     Musculoskeletal: Normal range of motion and neck supple.     Thyroid: No thyroid mass, thyromegaly or thyroid tenderness.     Vascular: No carotid bruit or JVD.     Trachea: Trachea and phonation normal.  Cardiovascular:     Rate and Rhythm: Normal rate and regular rhythm.     Chest Wall: PMI is not displaced.     Pulses: Normal pulses.     Heart sounds: Normal heart sounds. No murmur. No friction rub. No gallop.   Pulmonary:     Effort: Pulmonary effort is normal. No respiratory distress.     Breath sounds: Normal breath sounds. No wheezing.  Abdominal:     General: Bowel sounds are normal. There is no distension or abdominal bruit.     Palpations: Abdomen is soft. There is no hepatomegaly or splenomegaly.     Tenderness: There is no abdominal tenderness. There is no right CVA tenderness or left CVA tenderness.      Hernia: No hernia is present.  Musculoskeletal: Normal range of motion.     Right lower leg: No edema.     Left lower leg: No edema.  Lymphadenopathy:     Cervical: No cervical adenopathy.  Skin:    General: Skin is warm and dry.     Capillary Refill: Capillary refill takes less than 2 seconds.     Coloration: Skin is not cyanotic, jaundiced or pale.     Findings: No rash.  Neurological:     General: No focal deficit present.     Mental Status: He is alert and oriented to person, place, and time.     Cranial Nerves: Cranial nerves are intact.     Sensory: Sensation is intact.     Motor: Motor function is intact.     Coordination: Coordination is intact.     Gait: Gait is intact.     Deep Tendon Reflexes: Reflexes are normal and symmetric.  Psychiatric:        Attention and Perception: Attention and perception normal.        Mood and Affect: Mood and affect normal.        Speech: Speech normal.        Behavior: Behavior normal. Behavior is cooperative.        Thought Content: Thought content normal.        Cognition and Memory: Cognition and memory normal.        Judgment: Judgment normal.     Results for orders placed or performed in visit on 10/14/18  CBC with Differential/Platelet  Result Value Ref Range   WBC 7.3 3.4 - 10.8 x10E3/uL   RBC 4.96 4.14 - 5.80 x10E6/uL   Hemoglobin 14.9 13.0 - 17.7 g/dL   Hematocrit 43.5 37.5 - 51.0 %   MCV 88 79 - 97 fL   MCH 30.0 26.6 - 33.0 pg   MCHC 34.3 31.5 - 35.7 g/dL   RDW 12.8 12.3 - 15.4 %  Platelets 311 150 - 450 x10E3/uL   Neutrophils 60 Not Estab. %   Lymphs 30 Not Estab. %   Monocytes 8 Not Estab. %   Eos 1 Not Estab. %   Basos 1 Not Estab. %   Neutrophils Absolute 4.4 1.4 - 7.0 x10E3/uL   Lymphocytes Absolute 2.2 0.7 - 3.1 x10E3/uL   Monocytes Absolute 0.6 0.1 - 0.9 x10E3/uL   EOS (ABSOLUTE) 0.1 0.0 - 0.4 x10E3/uL   Basophils Absolute 0.1 0.0 - 0.2 x10E3/uL   Immature Granulocytes 0 Not Estab. %   Immature Grans  (Abs) 0.0 0.0 - 0.1 x10E3/uL  CMP14+EGFR  Result Value Ref Range   Glucose 96 65 - 99 mg/dL   BUN 12 8 - 27 mg/dL   Creatinine, Ser 0.86 0.76 - 1.27 mg/dL   GFR calc non Af Amer 94 >59 mL/min/1.73   GFR calc Af Amer 108 >59 mL/min/1.73   BUN/Creatinine Ratio 14 10 - 24   Sodium 139 134 - 144 mmol/L   Potassium 4.1 3.5 - 5.2 mmol/L   Chloride 101 96 - 106 mmol/L   CO2 24 20 - 29 mmol/L   Calcium 9.5 8.6 - 10.2 mg/dL   Total Protein 6.9 6.0 - 8.5 g/dL   Albumin 4.3 3.6 - 4.8 g/dL   Globulin, Total 2.6 1.5 - 4.5 g/dL   Albumin/Globulin Ratio 1.7 1.2 - 2.2   Bilirubin Total 0.5 0.0 - 1.2 mg/dL   Alkaline Phosphatase 91 39 - 117 IU/L   AST 24 0 - 40 IU/L   ALT 28 0 - 44 IU/L  Lipid panel  Result Value Ref Range   Cholesterol, Total 172 100 - 199 mg/dL   Triglycerides 119 0 - 149 mg/dL   HDL 42 >39 mg/dL   VLDL Cholesterol Cal 24 5 - 40 mg/dL   LDL Calculated 106 (H) 0 - 99 mg/dL   Chol/HDL Ratio 4.1 0.0 - 5.0 ratio  Microalbumin / creatinine urine ratio  Result Value Ref Range   Creatinine, Urine 275.9 Not Estab. mg/dL   Microalbumin, Urine 12.8 Not Estab. ug/mL   Microalb/Creat Ratio 4.6 0.0 - 30.0 mg/g creat       Pertinent labs & imaging results that were available during my care of the patient were reviewed by me and considered in my medical decision making.  Assessment & Plan:  Shia was seen today for hyperlipidemia and hypertension.  Diagnoses and all orders for this visit:  Essential hypertension DASH diet encouraged. Well controlled with current medications. Labs pending. Continue below.  -     amLODipine (NORVASC) 5 MG tablet; Take 1 tablet (5 mg total) by mouth daily. -     CBC with Differential/Platelet -     CMP14+EGFR -     Thyroid Panel With TSH  Mixed hyperlipidemia Diet and exercise discussed. Labs pending. Continue medications as prescribed.  -     atorvastatin (LIPITOR) 40 MG tablet; Take 1 tablet (40 mg total) by mouth daily. -     CMP14+EGFR -      Lipid panel  Benign prostatic hyperplasia with urinary hesitancy Increasing hesitancy and weak stream. Was offered Flomax in the past and declined. Pt states he would like to trial this at this time. PSA have been unremarkable. Pt aware to report any new or worsening symptoms. Pt aware to dose at night to prevent orthostatic hypotension. Pt aware if symptoms continue to worsen, he will require a referral to urology.  -  tamsulosin (FLOMAX) 0.4 MG CAPS capsule; Take 1 capsule (0.4 mg total) by mouth daily.  Vitamin D deficiency Taking over the counter repletion therapy daily. Has noticed an increase in energy since initiation. Will check levels today.  -     VITAMIN D 25 Hydroxy (Vit-D Deficiency, Fractures)  Obstructive sleep apnea Pt does use CPAP, tolerating current settings well. Will refer to sleep medicine if needed.     Continue all other maintenance medications.  Follow up plan: Return in about 3 months (around 07/14/2019), or if symptoms worsen or fail to improve, for HTN, BPH, CPE.   The above assessment and management plan was discussed with the patient. The patient verbalized understanding of and has agreed to the management plan. Patient is aware to call the clinic if symptoms persist or worsen. Patient is aware when to return to the clinic for a follow-up visit. Patient educated on when it is appropriate to go to the emergency department.   Monia Pouch, FNP-C [facility] [phone]

## 2019-04-14 LAB — CMP14+EGFR
ALT: 27 IU/L (ref 0–44)
AST: 25 IU/L (ref 0–40)
Albumin/Globulin Ratio: 2 (ref 1.2–2.2)
Albumin: 4.8 g/dL (ref 3.8–4.8)
Alkaline Phosphatase: 93 IU/L (ref 39–117)
BUN/Creatinine Ratio: 16 (ref 10–24)
BUN: 16 mg/dL (ref 8–27)
Bilirubin Total: 0.7 mg/dL (ref 0.0–1.2)
CO2: 24 mmol/L (ref 20–29)
Calcium: 9.9 mg/dL (ref 8.6–10.2)
Chloride: 102 mmol/L (ref 96–106)
Creatinine, Ser: 1 mg/dL (ref 0.76–1.27)
GFR calc Af Amer: 93 mL/min/{1.73_m2} (ref >59)
GFR calc non Af Amer: 80 mL/min/{1.73_m2} (ref >59)
Globulin, Total: 2.4 g/dL (ref 1.5–4.5)
Glucose: 96 mg/dL (ref 65–99)
Potassium: 4.6 mmol/L (ref 3.5–5.2)
Sodium: 138 mmol/L (ref 134–144)
Total Protein: 7.2 g/dL (ref 6.0–8.5)

## 2019-04-14 LAB — THYROID PANEL WITH TSH
Free Thyroxine Index: 1.8 (ref 1.2–4.9)
T3 Uptake Ratio: 26 % (ref 24–39)
T4, Total: 6.9 ug/dL (ref 4.5–12.0)
TSH: 1.4 u[IU]/mL (ref 0.450–4.500)

## 2019-04-14 LAB — LIPID PANEL
Chol/HDL Ratio: 4.5 ratio (ref 0.0–5.0)
Cholesterol, Total: 178 mg/dL (ref 100–199)
HDL: 40 mg/dL (ref >39)
LDL Calculated: 112 mg/dL — ABNORMAL HIGH (ref 0–99)
Triglycerides: 129 mg/dL (ref 0–149)
VLDL Cholesterol Cal: 26 mg/dL (ref 5–40)

## 2019-04-14 LAB — CBC WITH DIFFERENTIAL/PLATELET
Basophils Absolute: 0 10*3/uL (ref 0.0–0.2)
Basos: 1 %
EOS (ABSOLUTE): 0.1 10*3/uL (ref 0.0–0.4)
Eos: 1 %
Hematocrit: 43.1 % (ref 37.5–51.0)
Hemoglobin: 14.9 g/dL (ref 13.0–17.7)
Immature Grans (Abs): 0 10*3/uL (ref 0.0–0.1)
Immature Granulocytes: 0 %
Lymphocytes Absolute: 2.3 10*3/uL (ref 0.7–3.1)
Lymphs: 37 %
MCH: 29.3 pg (ref 26.6–33.0)
MCHC: 34.6 g/dL (ref 31.5–35.7)
MCV: 85 fL (ref 79–97)
Monocytes Absolute: 0.5 10*3/uL (ref 0.1–0.9)
Monocytes: 8 %
Neutrophils Absolute: 3.2 10*3/uL (ref 1.4–7.0)
Neutrophils: 53 %
Platelets: 327 10*3/uL (ref 150–450)
RBC: 5.09 x10E6/uL (ref 4.14–5.80)
RDW: 12.6 % (ref 11.6–15.4)
WBC: 6.1 10*3/uL (ref 3.4–10.8)

## 2019-04-14 LAB — VITAMIN D 25 HYDROXY (VIT D DEFICIENCY, FRACTURES): Vit D, 25-Hydroxy: 31 ng/mL (ref 30.0–100.0)

## 2019-04-20 ENCOUNTER — Encounter: Payer: Self-pay | Admitting: *Deleted

## 2019-07-14 ENCOUNTER — Other Ambulatory Visit: Payer: Self-pay

## 2019-07-17 ENCOUNTER — Ambulatory Visit (INDEPENDENT_AMBULATORY_CARE_PROVIDER_SITE_OTHER): Payer: 59

## 2019-07-17 ENCOUNTER — Other Ambulatory Visit: Payer: Self-pay

## 2019-07-17 ENCOUNTER — Ambulatory Visit: Payer: 59

## 2019-07-17 DIAGNOSIS — Z23 Encounter for immunization: Secondary | ICD-10-CM | POA: Diagnosis not present

## 2019-07-18 ENCOUNTER — Encounter: Payer: Self-pay | Admitting: Family Medicine

## 2019-10-16 ENCOUNTER — Ambulatory Visit: Payer: 59 | Admitting: Family Medicine

## 2019-12-08 ENCOUNTER — Telehealth: Payer: Self-pay | Admitting: Family Medicine

## 2019-12-08 ENCOUNTER — Other Ambulatory Visit: Payer: Self-pay | Admitting: Family Medicine

## 2019-12-08 ENCOUNTER — Other Ambulatory Visit: Payer: Self-pay

## 2019-12-08 DIAGNOSIS — E559 Vitamin D deficiency, unspecified: Secondary | ICD-10-CM

## 2019-12-08 DIAGNOSIS — I10 Essential (primary) hypertension: Secondary | ICD-10-CM

## 2019-12-08 DIAGNOSIS — N401 Enlarged prostate with lower urinary tract symptoms: Secondary | ICD-10-CM

## 2019-12-08 DIAGNOSIS — E782 Mixed hyperlipidemia: Secondary | ICD-10-CM

## 2019-12-08 DIAGNOSIS — E8881 Metabolic syndrome: Secondary | ICD-10-CM

## 2019-12-08 NOTE — Telephone Encounter (Signed)
Orders placed.

## 2019-12-15 ENCOUNTER — Other Ambulatory Visit: Payer: Self-pay | Admitting: Family Medicine

## 2019-12-15 DIAGNOSIS — I10 Essential (primary) hypertension: Secondary | ICD-10-CM

## 2019-12-15 DIAGNOSIS — E782 Mixed hyperlipidemia: Secondary | ICD-10-CM

## 2019-12-18 ENCOUNTER — Ambulatory Visit (INDEPENDENT_AMBULATORY_CARE_PROVIDER_SITE_OTHER): Payer: 59 | Admitting: Family Medicine

## 2019-12-18 ENCOUNTER — Encounter: Payer: Self-pay | Admitting: Family Medicine

## 2019-12-18 ENCOUNTER — Other Ambulatory Visit: Payer: Self-pay

## 2019-12-18 VITALS — BP 116/67 | HR 70 | Temp 99.0°F | Resp 20 | Ht 68.0 in | Wt 190.0 lb

## 2019-12-18 DIAGNOSIS — E8881 Metabolic syndrome: Secondary | ICD-10-CM

## 2019-12-18 DIAGNOSIS — Z0001 Encounter for general adult medical examination with abnormal findings: Secondary | ICD-10-CM | POA: Diagnosis not present

## 2019-12-18 DIAGNOSIS — Z0000 Encounter for general adult medical examination without abnormal findings: Secondary | ICD-10-CM

## 2019-12-18 DIAGNOSIS — I10 Essential (primary) hypertension: Secondary | ICD-10-CM | POA: Diagnosis not present

## 2019-12-18 DIAGNOSIS — G4733 Obstructive sleep apnea (adult) (pediatric): Secondary | ICD-10-CM

## 2019-12-18 DIAGNOSIS — R3911 Hesitancy of micturition: Secondary | ICD-10-CM

## 2019-12-18 DIAGNOSIS — N401 Enlarged prostate with lower urinary tract symptoms: Secondary | ICD-10-CM

## 2019-12-18 DIAGNOSIS — E782 Mixed hyperlipidemia: Secondary | ICD-10-CM | POA: Diagnosis not present

## 2019-12-18 DIAGNOSIS — E559 Vitamin D deficiency, unspecified: Secondary | ICD-10-CM

## 2019-12-18 NOTE — Patient Instructions (Signed)
DASH Eating Plan DASH stands for "Dietary Approaches to Stop Hypertension." The DASH eating plan is a healthy eating plan that has been shown to reduce high blood pressure (hypertension). Additional health benefits may include reducing the risk of type 2 diabetes mellitus, heart disease, and stroke. The DASH eating plan may also help with weight loss.  WHAT DO I NEED TO KNOW ABOUT THE DASH EATING PLAN? For the DASH eating plan, you will follow these general guidelines:  Choose foods with a percent daily value for sodium of less than 5% (as listed on the food label).  Use salt-free seasonings or herbs instead of table salt or sea salt.  Check with your health care provider or pharmacist before using salt substitutes.  Eat lower-sodium products, often labeled as "lower sodium" or "no salt added."  Eat fresh foods.  Eat more vegetables, fruits, and low-fat dairy products.  Choose whole grains. Look for the word "whole" as the first word in the ingredient list.  Choose fish and skinless chicken or turkey more often than red meat. Limit fish, poultry, and meat to 6 oz (170 g) each day.  Limit sweets, desserts, sugars, and sugary drinks.  Choose heart-healthy fats.  Limit cheese to 1 oz (28 g) per day.  Eat more home-cooked food and less restaurant, buffet, and fast food.  Limit fried foods.  Cook foods using methods other than frying.  Limit canned vegetables. If you do use them, rinse them well to decrease the sodium.  When eating at a restaurant, ask that your food be prepared with less salt, or no salt if possible.  WHAT FOODS CAN I EAT? Seek help from a dietitian for individual calorie needs.  Grains Whole grain or whole wheat bread. Brown rice. Whole grain or whole wheat pasta. Quinoa, bulgur, and whole grain cereals. Low-sodium cereals. Corn or whole wheat flour tortillas. Whole grain cornbread. Whole grain crackers. Low-sodium crackers.  Vegetables Fresh or frozen  vegetables (raw, steamed, roasted, or grilled). Low-sodium or reduced-sodium tomato and vegetable juices. Low-sodium or reduced-sodium tomato sauce and paste. Low-sodium or reduced-sodium canned vegetables.   Fruits All fresh, canned (in natural juice), or frozen fruits.  Meat and Other Protein Products Ground beef (85% or leaner), grass-fed beef, or beef trimmed of fat. Skinless chicken or turkey. Ground chicken or turkey. Pork trimmed of fat. All fish and seafood. Eggs. Dried beans, peas, or lentils. Unsalted nuts and seeds. Unsalted canned beans.  Dairy Low-fat dairy products, such as skim or 1% milk, 2% or reduced-fat cheeses, low-fat ricotta or cottage cheese, or plain low-fat yogurt. Low-sodium or reduced-sodium cheeses.  Fats and Oils Tub margarines without trans fats. Light or reduced-fat mayonnaise and salad dressings (reduced sodium). Avocado. Safflower, olive, or canola oils. Natural peanut or almond butter.  Other Unsalted popcorn and pretzels. The items listed above may not be a complete list of recommended foods or beverages. Contact your dietitian for more options.  WHAT FOODS ARE NOT RECOMMENDED?  Grains White bread. White pasta. White rice. Refined cornbread. Bagels and croissants. Crackers that contain trans fat.  Vegetables Creamed or fried vegetables. Vegetables in a cheese sauce. Regular canned vegetables. Regular canned tomato sauce and paste. Regular tomato and vegetable juices.  Fruits Dried fruits. Canned fruit in light or heavy syrup. Fruit juice.  Meat and Other Protein Products Fatty cuts of meat. Ribs, chicken wings, bacon, sausage, bologna, salami, chitterlings, fatback, hot dogs, bratwurst, and packaged luncheon meats. Salted nuts and seeds. Canned beans with salt.    Dairy Whole or 2% milk, cream, half-and-half, and cream cheese. Whole-fat or sweetened yogurt. Full-fat cheeses or blue cheese. Nondairy creamers and whipped toppings. Processed cheese,  cheese spreads, or cheese curds.  Condiments Onion and garlic salt, seasoned salt, table salt, and sea salt. Canned and packaged gravies. Worcestershire sauce. Tartar sauce. Barbecue sauce. Teriyaki sauce. Soy sauce, including reduced sodium. Steak sauce. Fish sauce. Oyster sauce. Cocktail sauce. Horseradish. Ketchup and mustard. Meat flavorings and tenderizers. Bouillon cubes. Hot sauce. Tabasco sauce. Marinades. Taco seasonings. Relishes.  Fats and Oils Butter, stick margarine, lard, shortening, ghee, and bacon fat. Coconut, palm kernel, or palm oils. Regular salad dressings.  Other Pickles and olives. Salted popcorn and pretzels.  The items listed above may not be a complete list of foods and beverages to avoid. Contact your dietitian for more information.  WHERE CAN I FIND MORE INFORMATION? National Heart, Lung, and [employer] Institute: www.nhlbi.nih.gov/health/health-topics/topics/dash/ Document Released: 09/17/2011 Document Revised: 02/12/2014 Document Reviewed: 08/02/2013 Exit[employer] Patient Information 2015 [employer]. This information is not intended to replace advice given to you by your health care provider. Make sure you discuss any questions you have with your health care provider.   I think that you would greatly benefit from seeing a nutritionist.  If you are interested, please call Dr Sykes at [phone] to schedule an appointment.   

## 2019-12-18 NOTE — Progress Notes (Signed)
Subjective:  Patient ID: Cody Hernandez, male    DOB: 04/08/1956, 64 y.o.   MRN: [id number]  Patient Care Team: Baruch Gouty, FNP as PCP - General (Family Medicine)   Chief Complaint:  Annual Exam (CPE ), Medical Management of Chronic Issues, Hyperlipidemia, and Hypertension   HPI: Cody Hernandez is a 64 y.o. male presenting on 12/18/2019 for Annual Exam (CPE ), Medical Management of Chronic Issues, Hyperlipidemia, and Hypertension  Pt presents today for his annual physical exam. Pt states he is doing fairly well overall. States he has noticed an increase in his BPH symptoms. He has a weak stream, hesitancy, and nocturia. States he never started the Flomax but feels he needs to at this point. No scrotal pain or swelling, rectal pain or pressure, or changes in bowel habits. Pt states he has been using his CPAP as prescribed and feels better when he does use this. States he needs supplies for his CPAP. He states his blood pressure has been well controlled and he is taking his medications as prescribed. No chest pain, shortness of breath, leg swelling, headaches, or dizziness. He does take his statin as prescribed without myalgias. Active daily and does try to watch diet. No other complaints or concerns.    Relevant past medical, surgical, family, and social history reviewed and updated as indicated.  Allergies and medications reviewed and updated. Date reviewed: Chart in Epic.   Past Medical History:  Diagnosis Date  . Blood clotting disorder ([facility])    DVT/PE x2 Coumadin x 2 years 2006  . Colon cancer ([place])   . High cholesterol   . Hypertension   . Pulmonary embolism ([place])   . Sleep apnea     Past Surgical History:  Procedure Laterality Date  . APPENDECTOMY  1977  . COLON SURGERY  2007  . neck/back surgery  1995 and 2010    Social History   Socioeconomic History  . Marital status: Married    Spouse name: Not on file  . Number of children: 0  . Years of education: Not on file   . Highest education level: Not on file  Occupational History    Employer: [employer]  Tobacco Use  . Smoking status: Former Smoker    Packs/day: 1.00    Years: 20.00    Pack years: 20.00    Types: Cigarettes    Quit date: 10/12/1992    Years since quitting: 27.2  . Smokeless tobacco: Never Used  Substance and Sexual Activity  . Alcohol use: Not Currently  . Drug use: No  . Sexual activity: Not on file  Other Topics Concern  . Not on file  Social History Narrative  . Not on file   Social Determinants of Health   Financial Resource Strain:   . Difficulty of Paying Living Expenses: Not on file  Food Insecurity:   . Worried About Charity fundraiser in the Last Year: Not on file  . Ran Out of Food in the Last Year: Not on file  Transportation Needs:   . Lack of Transportation (Medical): Not on file  . Lack of Transportation (Non-Medical): Not on file  Physical Activity:   . Days of Exercise per Week: Not on file  . Minutes of Exercise per Session: Not on file  Stress:   . Feeling of Stress : Not on file  Social Connections:   . Frequency of Communication with Friends and Family: Not on file  . Frequency of  Social Gatherings with Friends and Family: Not on file  . Attends Religious Services: Not on file  . Active Member of Clubs or Organizations: Not on file  . Attends Archivist Meetings: Not on file  . Marital Status: Not on file  Intimate Partner Violence:   . Fear of Current or Ex-Partner: Not on file  . Emotionally Abused: Not on file  . Physically Abused: Not on file  . Sexually Abused: Not on file    Outpatient Encounter Medications as of 12/18/2019  Medication Sig  . amLODipine (NORVASC) 5 MG tablet TAKE 1 TABLET DAILY  . aspirin 81 MG tablet Take 81 mg by mouth daily.    Marland Kitchen atorvastatin (LIPITOR) 40 MG tablet TAKE 1 TABLET DAILY  . [DISCONTINUED] tamsulosin (FLOMAX) 0.4 MG CAPS capsule Take 1 capsule (0.4 mg total) by mouth daily.   No  facility-administered encounter medications on file as of 12/18/2019.    Allergies  Allergen Reactions  . Benadryl [Diphenhydramine Hcl]     Nerves "crawling" feelings  . Latex Other (See Comments)    Blisters    Review of Systems  Constitutional: Negative for activity change, appetite change, chills, diaphoresis, fatigue, fever and unexpected weight change.  HENT: Negative.   Eyes: Negative.  Negative for photophobia and visual disturbance.  Respiratory: Negative for cough, chest tightness and shortness of breath.   Cardiovascular: Negative for chest pain, palpitations and leg swelling.  Gastrointestinal: Negative for abdominal pain, blood in stool, constipation, diarrhea, nausea and vomiting.  Endocrine: Negative.  Negative for cold intolerance, heat intolerance, polydipsia, polyphagia and polyuria.  Genitourinary: Positive for difficulty urinating. Negative for decreased urine volume, discharge, dysuria, flank pain, frequency, genital sores, hematuria, penile pain, penile swelling, scrotal swelling, testicular pain and urgency.  Musculoskeletal: Positive for arthralgias. Negative for back pain, gait problem, joint swelling, myalgias, neck pain and neck stiffness.  Skin: Negative.   Allergic/Immunologic: Negative.   Neurological: Negative for dizziness, tremors, seizures, syncope, facial asymmetry, speech difficulty, weakness, light-headedness, numbness and headaches.  Hematological: Negative.  Does not bruise/bleed easily.  Psychiatric/Behavioral: Negative for confusion, hallucinations, sleep disturbance and suicidal ideas.  All other systems reviewed and are negative.       Objective:  BP 116/67   Pulse 70   Temp 99 F (37.2 C)   Resp 20   Ht '5\' 8"'$  (1.727 m)   Wt 190 lb (86.2 kg)   SpO2 96%   BMI 28.89 kg/m    Wt Readings from Last 3 Encounters:  12/18/19 190 lb (86.2 kg)  04/13/19 191 lb (86.6 kg)  10/14/18 180 lb (81.6 kg)    Physical Exam Vitals and nursing note  reviewed.  Constitutional:      General: He is not in acute distress.    Appearance: Normal appearance. He is well-developed, well-groomed and overweight. He is not ill-appearing, toxic-appearing or diaphoretic.  HENT:     Head: Normocephalic and atraumatic.     Jaw: There is normal jaw occlusion.     Right Ear: Hearing, tympanic membrane, ear canal and external ear normal.     Left Ear: Hearing, tympanic membrane, ear canal and external ear normal.     Nose: Nose normal.     Mouth/Throat:     Lips: Pink.     Mouth: Mucous membranes are moist.     Pharynx: Oropharynx is clear. Uvula midline.  Eyes:     General: Lids are normal.     Extraocular Movements: Extraocular movements intact.  Conjunctiva/sclera: Conjunctivae normal.     Pupils: Pupils are equal, round, and reactive to light.  Neck:     Thyroid: No thyroid mass, thyromegaly or thyroid tenderness.     Vascular: No carotid bruit or JVD.     Trachea: Trachea and phonation normal.  Cardiovascular:     Rate and Rhythm: Normal rate and regular rhythm.     Chest Wall: PMI is not displaced.     Pulses: Normal pulses.     Heart sounds: Normal heart sounds. No murmur. No friction rub. No gallop.   Pulmonary:     Effort: Pulmonary effort is normal. No respiratory distress.     Breath sounds: Normal breath sounds. No wheezing.  Abdominal:     General: Bowel sounds are normal. There is no distension or abdominal bruit.     Palpations: Abdomen is soft. There is no hepatomegaly or splenomegaly.     Tenderness: There is no abdominal tenderness. There is no right CVA tenderness or left CVA tenderness.     Hernia: No hernia is present.  Musculoskeletal:        General: Normal range of motion.     Cervical back: Normal range of motion and neck supple.     Right lower leg: No edema.     Left lower leg: No edema.  Lymphadenopathy:     Cervical: No cervical adenopathy.  Skin:    General: Skin is warm and dry.     Capillary Refill:  Capillary refill takes less than 2 seconds.     Coloration: Skin is not cyanotic, jaundiced or pale.     Findings: No rash.  Neurological:     General: No focal deficit present.     Mental Status: He is alert and oriented to person, place, and time.     Cranial Nerves: Cranial nerves are intact. No cranial nerve deficit.     Sensory: Sensation is intact. No sensory deficit.     Motor: Motor function is intact. No weakness.     Coordination: Coordination is intact. Coordination normal.     Gait: Gait is intact. Gait normal.     Deep Tendon Reflexes: Reflexes are normal and symmetric. Reflexes normal.  Psychiatric:        Attention and Perception: Attention and perception normal.        Mood and Affect: Mood and affect normal.        Speech: Speech normal.        Behavior: Behavior normal. Behavior is cooperative.        Thought Content: Thought content normal.        Cognition and Memory: Cognition and memory normal.        Judgment: Judgment normal.     Results for orders placed or performed in visit on 04/13/19  CBC with Differential/Platelet  Result Value Ref Range   WBC 6.1 3.4 - 10.8 x10E3/uL   RBC 5.09 4.14 - 5.80 x10E6/uL   Hemoglobin 14.9 13.0 - 17.7 g/dL   Hematocrit 43.1 37.5 - 51.0 %   MCV 85 79 - 97 fL   MCH 29.3 26.6 - 33.0 pg   MCHC 34.6 31.5 - 35.7 g/dL   RDW 12.6 11.6 - 15.4 %   Platelets 327 150 - 450 x10E3/uL   Neutrophils 53 Not Estab. %   Lymphs 37 Not Estab. %   Monocytes 8 Not Estab. %   Eos 1 Not Estab. %   Basos 1 Not Estab. %  Neutrophils Absolute 3.2 1.4 - 7.0 x10E3/uL   Lymphocytes Absolute 2.3 0.7 - 3.1 x10E3/uL   Monocytes Absolute 0.5 0.1 - 0.9 x10E3/uL   EOS (ABSOLUTE) 0.1 0.0 - 0.4 x10E3/uL   Basophils Absolute 0.0 0.0 - 0.2 x10E3/uL   Immature Granulocytes 0 Not Estab. %   Immature Grans (Abs) 0.0 0.0 - 0.1 x10E3/uL  CMP14+EGFR  Result Value Ref Range   Glucose 96 65 - 99 mg/dL   BUN 16 8 - 27 mg/dL   Creatinine, Ser 1.00 0.76 - 1.27  mg/dL   GFR calc non Af Amer 80 >59 mL/min/1.73   GFR calc Af Amer 93 >59 mL/min/1.73   BUN/Creatinine Ratio 16 10 - 24   Sodium 138 134 - 144 mmol/L   Potassium 4.6 3.5 - 5.2 mmol/L   Chloride 102 96 - 106 mmol/L   CO2 24 20 - 29 mmol/L   Calcium 9.9 8.6 - 10.2 mg/dL   Total Protein 7.2 6.0 - 8.5 g/dL   Albumin 4.8 3.8 - 4.8 g/dL   Globulin, Total 2.4 1.5 - 4.5 g/dL   Albumin/Globulin Ratio 2.0 1.2 - 2.2   Bilirubin Total 0.7 0.0 - 1.2 mg/dL   Alkaline Phosphatase 93 39 - 117 IU/L   AST 25 0 - 40 IU/L   ALT 27 0 - 44 IU/L  Lipid panel  Result Value Ref Range   Cholesterol, Total 178 100 - 199 mg/dL   Triglycerides 129 0 - 149 mg/dL   HDL 40 >39 mg/dL   VLDL Cholesterol Cal 26 5 - 40 mg/dL   LDL Calculated 112 (H) 0 - 99 mg/dL   Chol/HDL Ratio 4.5 0.0 - 5.0 ratio  Thyroid Panel With TSH  Result Value Ref Range   TSH 1.400 0.450 - 4.500 uIU/mL   T4, Total 6.9 4.5 - 12.0 ug/dL   T3 Uptake Ratio 26 24 - 39 %   Free Thyroxine Index 1.8 1.2 - 4.9  VITAMIN D 25 Hydroxy (Vit-D Deficiency, Fractures)  Result Value Ref Range   Vit D, 25-Hydroxy 31.0 30.0 - 100.0 ng/mL       Pertinent labs & imaging results that were available during my care of the patient were reviewed by me and considered in my medical decision making.  Assessment & Plan:  Destry was seen today for annual exam, medical management of chronic issues, hyperlipidemia and hypertension.  Diagnoses and all orders for this visit:  Annual physical exam Health maintenance discussed. Labs pending. Diet and exercise encouraged.  -     Thyroid Panel With TSH -     CBC with Differential/Platelet -     CMP14+EGFR -     Lipid panel -     PSA, total and free -     VITAMIN D 25 Hydroxy (Vit-D Deficiency, Fractures)  Essential hypertension BP well controlled. Changes were not made in regimen today. Goal BP is 130/80. Pt aware to report any persistent high or low readings. DASH diet and exercise encouraged. Exercise at least  150 minutes per week and increase as tolerated. Goal BMI > 25. Stress management encouraged. Avoid nicotine and tobacco product use. Avoid excessive alcohol and NSAID's. Avoid more than 2000 mg of sodium daily. Medications as prescribed. Follow up as scheduled.  -     Thyroid Panel With TSH -     CBC with Differential/Platelet -     CMP14+EGFR -     Lipid panel  Metabolic syndrome Diet and exercise encouraged. Labs pending.  -  Thyroid Panel With TSH -     CBC with Differential/Platelet -     CMP14+EGFR -     Lipid panel  Mixed hyperlipidemia Diet encouraged - increase intake of fresh fruits and vegetables, increase intake of lean proteins. Bake, broil, or grill foods. Avoid fried, greasy, and fatty foods. Avoid fast foods. Increase intake of fiber-rich whole grains. Exercise encouraged - at least 150 minutes per week and advance as tolerated.  Goal BMI < 25. Continue medications as prescribed. Follow up in 3-6 months as discussed.  -     Lipid panel  Benign prostatic hyperplasia with urinary hesitancy Pt is complaining of worsening symptoms. Pt willing to initiate Flomax. Will report any new, worsening, or persistent symptoms.  -     PSA, total and free  Vitamin D deficiency Labs pending. Continue repletion therapy. If indicated, will change repletion dosage. Eat foods rich in Vit D including milk, orange juice, yogurt with vitamin D added, salmon or mackerel, canned tuna fish, cereals with vitamin D added, and cod liver oil. Get out in the sun but make sure to wear at least SPF 30 sunscreen.  -     VITAMIN D 25 Hydroxy (Vit-D Deficiency, Fractures)  Obstructive sleep apnea Doing well on current settings, will continue.  -     For home use only DME Other see comment     Continue all other maintenance medications.  Follow up plan: Return in about 6 months (around 06/19/2020), or if symptoms worsen or fail to improve.  Continue healthy lifestyle choices, including diet (rich in  fruits, vegetables, and lean proteins, and low in salt and simple carbohydrates) and exercise (at least 30 minutes of moderate physical activity daily).  Educational handout given for DASH diet  The above assessment and management plan was discussed with the patient. The patient verbalized understanding of and has agreed to the management plan. Patient is aware to call the clinic if they develop any new symptoms or if symptoms persist or worsen. Patient is aware when to return to the clinic for a follow-up visit. Patient educated on when it is appropriate to go to the emergency department.   Monia Pouch, FNP-C [facility] [phone]

## 2019-12-19 LAB — CBC WITH DIFFERENTIAL/PLATELET
Basophils Absolute: 0 10*3/uL (ref 0.0–0.2)
Basos: 1 %
EOS (ABSOLUTE): 0.1 10*3/uL (ref 0.0–0.4)
Eos: 1 %
Hematocrit: 42.8 % (ref 37.5–51.0)
Hemoglobin: 14.9 g/dL (ref 13.0–17.7)
Immature Grans (Abs): 0 10*3/uL (ref 0.0–0.1)
Immature Granulocytes: 0 %
Lymphocytes Absolute: 2.3 10*3/uL (ref 0.7–3.1)
Lymphs: 32 %
MCH: 30.2 pg (ref 26.6–33.0)
MCHC: 34.8 g/dL (ref 31.5–35.7)
MCV: 87 fL (ref 79–97)
Monocytes Absolute: 0.5 10*3/uL (ref 0.1–0.9)
Monocytes: 7 %
Neutrophils Absolute: 4.3 10*3/uL (ref 1.4–7.0)
Neutrophils: 59 %
Platelets: 295 10*3/uL (ref 150–450)
RBC: 4.93 x10E6/uL (ref 4.14–5.80)
RDW: 12.6 % (ref 11.6–15.4)
WBC: 7.2 10*3/uL (ref 3.4–10.8)

## 2019-12-19 LAB — LIPID PANEL
Chol/HDL Ratio: 3.8 ratio (ref 0.0–5.0)
Cholesterol, Total: 146 mg/dL (ref 100–199)
HDL: 38 mg/dL — ABNORMAL LOW (ref >39)
LDL Chol Calc (NIH): 86 mg/dL (ref 0–99)
Triglycerides: 124 mg/dL (ref 0–149)
VLDL Cholesterol Cal: 22 mg/dL (ref 5–40)

## 2019-12-19 LAB — THYROID PANEL WITH TSH
Free Thyroxine Index: 1.6 (ref 1.2–4.9)
T3 Uptake Ratio: 26 % (ref 24–39)
T4, Total: 6 ug/dL (ref 4.5–12.0)
TSH: 1.31 u[IU]/mL (ref 0.450–4.500)

## 2019-12-19 LAB — CMP14+EGFR
ALT: 25 IU/L (ref 0–44)
AST: 22 IU/L (ref 0–40)
Albumin/Globulin Ratio: 1.7 (ref 1.2–2.2)
Albumin: 4.5 g/dL (ref 3.8–4.8)
Alkaline Phosphatase: 90 IU/L (ref 39–117)
BUN/Creatinine Ratio: 20 (ref 10–24)
BUN: 18 mg/dL (ref 8–27)
Bilirubin Total: 0.5 mg/dL (ref 0.0–1.2)
CO2: 24 mmol/L (ref 20–29)
Calcium: 9.3 mg/dL (ref 8.6–10.2)
Chloride: 104 mmol/L (ref 96–106)
Creatinine, Ser: 0.92 mg/dL (ref 0.76–1.27)
GFR calc Af Amer: 102 mL/min/{1.73_m2} (ref >59)
GFR calc non Af Amer: 88 mL/min/{1.73_m2} (ref >59)
Globulin, Total: 2.6 g/dL (ref 1.5–4.5)
Glucose: 94 mg/dL (ref 65–99)
Potassium: 4.4 mmol/L (ref 3.5–5.2)
Sodium: 143 mmol/L (ref 134–144)
Total Protein: 7.1 g/dL (ref 6.0–8.5)

## 2019-12-19 LAB — VITAMIN D 25 HYDROXY (VIT D DEFICIENCY, FRACTURES): Vit D, 25-Hydroxy: 19.2 ng/mL — ABNORMAL LOW (ref 30.0–100.0)

## 2019-12-19 LAB — PSA, TOTAL AND FREE
PSA, Free Pct: 14.7 %
PSA, Free: 0.22 ng/mL
Prostate Specific Ag, Serum: 1.5 ng/mL (ref 0.0–4.0)

## 2019-12-20 ENCOUNTER — Telehealth: Payer: Self-pay | Admitting: Family Medicine

## 2019-12-20 NOTE — Telephone Encounter (Signed)
Reviewed today.

## 2019-12-20 NOTE — Telephone Encounter (Signed)
Please advise 

## 2020-04-16 ENCOUNTER — Other Ambulatory Visit: Payer: Self-pay | Admitting: Family

## 2020-04-16 DIAGNOSIS — E782 Mixed hyperlipidemia: Secondary | ICD-10-CM

## 2020-06-07 ENCOUNTER — Telehealth: Payer: Self-pay | Admitting: Family Medicine

## 2020-06-07 DIAGNOSIS — I10 Essential (primary) hypertension: Secondary | ICD-10-CM

## 2020-06-07 MED ORDER — AMLODIPINE BESYLATE 5 MG PO TABS: 5.0000 mg | ORAL_TABLET | Freq: Every day | ORAL | 0 refills | Status: DC

## 2020-06-07 NOTE — Telephone Encounter (Signed)
  Prescription Request  06/07/2020  What is the name of the medication or equipment? Amlodipine Besylate & Tamsulosin HCL  Have you contacted your pharmacy to request a refill? (if applicable) Yes  Which pharmacy would you like this sent to? [facility]   Patient notified that their request is being sent to the clinical staff for review and that they should receive a response within 2 business days.   Rakes' pt, but has an appt with Lajuana Ripple coming up.  He is almost out.

## 2020-06-12 ENCOUNTER — Telehealth: Payer: Self-pay | Admitting: Family Medicine

## 2020-06-18 ENCOUNTER — Other Ambulatory Visit: Payer: Self-pay | Admitting: Family Medicine

## 2020-06-18 MED ORDER — TAMSULOSIN HCL 0.4 MG PO CAPS: 0.4000 mg | ORAL_CAPSULE | Freq: Every day | ORAL | 3 refills | Status: DC

## 2020-06-18 MED ORDER — AMLODIPINE BESYLATE 5 MG PO TABS: 5.0000 mg | ORAL_TABLET | Freq: Every day | ORAL | 0 refills | Status: DC

## 2020-06-18 NOTE — Addendum Note (Signed)
Addended by: Brynda Peon F on: 06/18/2020 11:20 AM   Modules accepted: Orders

## 2020-06-18 NOTE — Telephone Encounter (Signed)
Pt requesting a call from the nurse to discuss refills.

## 2020-06-18 NOTE — Telephone Encounter (Signed)
Patient states he needs Flomax .4mg  he is in [place] has the soonest appt with you. Not on med list please advise.

## 2020-06-27 ENCOUNTER — Other Ambulatory Visit: Payer: Self-pay

## 2020-06-27 ENCOUNTER — Telehealth: Payer: Self-pay

## 2020-06-27 ENCOUNTER — Other Ambulatory Visit: Payer: 59

## 2020-06-27 DIAGNOSIS — N401 Enlarged prostate with lower urinary tract symptoms: Secondary | ICD-10-CM

## 2020-06-27 DIAGNOSIS — E559 Vitamin D deficiency, unspecified: Secondary | ICD-10-CM

## 2020-06-27 DIAGNOSIS — I10 Essential (primary) hypertension: Secondary | ICD-10-CM

## 2020-06-27 DIAGNOSIS — E8881 Metabolic syndrome: Secondary | ICD-10-CM

## 2020-06-27 DIAGNOSIS — R300 Dysuria: Secondary | ICD-10-CM

## 2020-06-27 DIAGNOSIS — E782 Mixed hyperlipidemia: Secondary | ICD-10-CM

## 2020-06-27 LAB — URINALYSIS, COMPLETE
Bilirubin, UA: NEGATIVE
Glucose, UA: NEGATIVE
Ketones, UA: NEGATIVE
Leukocytes,UA: NEGATIVE
Nitrite, UA: NEGATIVE
Protein,UA: NEGATIVE
Specific Gravity, UA: >1.03 — ABNORMAL HIGH (ref 1.005–1.030)
Urobilinogen, Ur: 0.2 mg/dL (ref 0.2–1.0)
pH, UA: 5.5 (ref 5.0–7.5)

## 2020-06-27 LAB — MICROSCOPIC EXAMINATION
Bacteria, UA: NONE SEEN
RBC, Urine: NONE SEEN /hpf (ref 0–2)
WBC, UA: NONE SEEN /hpf (ref 0–5)

## 2020-06-27 NOTE — Telephone Encounter (Signed)
Patient has an appt with Dr Lajuana Ripple on Monday. He came in to the office today to leave a urine specimen and have labs drawn. He is complaining of abdominal pain and burning with urination. Can you review urine and see if ok to wait until Monday for appt with Dr Darnell Level or if patient needs to be seen sooner. He is worried it may be his prostate

## 2020-06-27 NOTE — Telephone Encounter (Signed)
His urinalysis does not look bad like a urinary tract infection, but I am unable to say if he has an infection of his prostate without further assessment. If he feels he needs assessment/treatment before Monday, he would need an appointment.

## 2020-06-28 ENCOUNTER — Ambulatory Visit: Payer: 59 | Admitting: Family Medicine

## 2020-06-28 LAB — CMP14+EGFR
ALT: 20 IU/L (ref 0–44)
AST: 19 IU/L (ref 0–40)
Albumin/Globulin Ratio: 1.7 (ref 1.2–2.2)
Albumin: 4.4 g/dL (ref 3.8–4.8)
Alkaline Phosphatase: 101 IU/L (ref 44–121)
BUN/Creatinine Ratio: 13 (ref 10–24)
BUN: 11 mg/dL (ref 8–27)
Bilirubin Total: 0.5 mg/dL (ref 0.0–1.2)
CO2: 25 mmol/L (ref 20–29)
Calcium: 9.4 mg/dL (ref 8.6–10.2)
Chloride: 102 mmol/L (ref 96–106)
Creatinine, Ser: 0.87 mg/dL (ref 0.76–1.27)
GFR calc Af Amer: 106 mL/min/{1.73_m2} (ref >59)
GFR calc non Af Amer: 92 mL/min/{1.73_m2} (ref >59)
Globulin, Total: 2.6 g/dL (ref 1.5–4.5)
Glucose: 97 mg/dL (ref 65–99)
Potassium: 3.9 mmol/L (ref 3.5–5.2)
Sodium: 138 mmol/L (ref 134–144)
Total Protein: 7 g/dL (ref 6.0–8.5)

## 2020-06-28 LAB — CBC WITH DIFFERENTIAL/PLATELET
Basophils Absolute: 0 10*3/uL (ref 0.0–0.2)
Basos: 0 %
EOS (ABSOLUTE): 0.1 10*3/uL (ref 0.0–0.4)
Eos: 1 %
Hematocrit: 42 % (ref 37.5–51.0)
Hemoglobin: 14.4 g/dL (ref 13.0–17.7)
Immature Grans (Abs): 0 10*3/uL (ref 0.0–0.1)
Immature Granulocytes: 1 %
Lymphocytes Absolute: 2 10*3/uL (ref 0.7–3.1)
Lymphs: 32 %
MCH: 29.8 pg (ref 26.6–33.0)
MCHC: 34.3 g/dL (ref 31.5–35.7)
MCV: 87 fL (ref 79–97)
Monocytes Absolute: 0.4 10*3/uL (ref 0.1–0.9)
Monocytes: 6 %
Neutrophils Absolute: 3.7 10*3/uL (ref 1.4–7.0)
Neutrophils: 60 %
Platelets: 305 10*3/uL (ref 150–450)
RBC: 4.84 x10E6/uL (ref 4.14–5.80)
RDW: 12.3 % (ref 11.6–15.4)
WBC: 6.3 10*3/uL (ref 3.4–10.8)

## 2020-06-28 LAB — VITAMIN D 25 HYDROXY (VIT D DEFICIENCY, FRACTURES): Vit D, 25-Hydroxy: 20.9 ng/mL — ABNORMAL LOW (ref 30.0–100.0)

## 2020-06-28 LAB — URINE CULTURE: Organism ID, Bacteria: NO GROWTH

## 2020-06-28 NOTE — Telephone Encounter (Signed)
Patient will wait till Monday. No appt available.

## 2020-07-01 ENCOUNTER — Ambulatory Visit (INDEPENDENT_AMBULATORY_CARE_PROVIDER_SITE_OTHER): Payer: 59 | Admitting: Family Medicine

## 2020-07-01 ENCOUNTER — Other Ambulatory Visit: Payer: Self-pay

## 2020-07-01 VITALS — BP 124/74 | HR 64 | Temp 97.9°F | Ht 68.0 in | Wt 188.0 lb

## 2020-07-01 DIAGNOSIS — Z7689 Persons encountering health services in other specified circumstances: Secondary | ICD-10-CM

## 2020-07-01 DIAGNOSIS — R3911 Hesitancy of micturition: Secondary | ICD-10-CM

## 2020-07-01 DIAGNOSIS — E782 Mixed hyperlipidemia: Secondary | ICD-10-CM

## 2020-07-01 DIAGNOSIS — I10 Essential (primary) hypertension: Secondary | ICD-10-CM | POA: Diagnosis not present

## 2020-07-01 DIAGNOSIS — Z23 Encounter for immunization: Secondary | ICD-10-CM

## 2020-07-01 DIAGNOSIS — G4733 Obstructive sleep apnea (adult) (pediatric): Secondary | ICD-10-CM

## 2020-07-01 DIAGNOSIS — N401 Enlarged prostate with lower urinary tract symptoms: Secondary | ICD-10-CM | POA: Diagnosis not present

## 2020-07-01 DIAGNOSIS — E559 Vitamin D deficiency, unspecified: Secondary | ICD-10-CM

## 2020-07-01 MED ORDER — ATORVASTATIN CALCIUM 40 MG PO TABS: 40.0000 mg | ORAL_TABLET | Freq: Every day | ORAL | 3 refills | Status: DC

## 2020-07-01 MED ORDER — AMLODIPINE BESYLATE 5 MG PO TABS: 5.0000 mg | ORAL_TABLET | Freq: Every day | ORAL | 3 refills | Status: DC

## 2020-07-01 NOTE — Progress Notes (Signed)
Subjective: CC: est care, HTN, HLD, BPH PCP: Rakes, Connye Burkitt, FNP (Inactive) Cody Hernandez is a 64 y.o. male presenting to clinic today for:  1. HTN w/ HLD Patient is compliant with Norvasc, atorvastatin.  He has OSA.  No chest pain, shortness of breath.  He does report some difficulty with urination.  See below.  2. BPH w/ urinary hesitancy, decreased urinary flow PSA in march was normal.  He notes that he was started on Flomax but never actually started taking it until about the last 3 to 4 weeks.  He had started it because urinary symptoms had gotten very severe such that he was having difficulty starting a urinary stream.  He was having pelvic pain.  He came in last week to get a urine sample because symptoms seem to be getting worse.  He has been on the Flomax for a couple of weeks now and this seems to be getting somewhat better now.  He is hydrating adequately.  He used to be established with Dr. Jeffie Pollock but has not seen him in some time.  He has an appointment on the 29th for recheck.   ROS: Per HPI  Allergies  Allergen Reactions   Benadryl [Diphenhydramine Hcl]     Nerves "crawling" feelings   Latex Other (See Comments)    Blisters   Past Medical History:  Diagnosis Date   Blood clotting disorder ([place])    DVT/PE x2 Coumadin x 2 years 2006   Colon cancer ([place])    High cholesterol    Hypertension    Pulmonary embolism ([facility])    Sleep apnea     Current Outpatient Medications:    amLODipine (NORVASC) 5 MG tablet, Take 1 tablet (5 mg total) by mouth daily., Disp: 90 tablet, Rfl: 0   aspirin 81 MG tablet, Take 81 mg by mouth daily.  , Disp: , Rfl:    atorvastatin (LIPITOR) 40 MG tablet, TAKE 1 TABLET DAILY, Disp: 90 tablet, Rfl: 0   tamsulosin (FLOMAX) 0.4 MG CAPS capsule, Take 1 capsule (0.4 mg total) by mouth daily., Disp: 90 capsule, Rfl: 3 Social History   Socioeconomic History   Marital status: Married    Spouse name: Not on file   Number of  children: 0   Years of education: Not on file   Highest education level: Not on file  Occupational History    Employer: [employer]  Tobacco Use   Smoking status: Former Smoker    Packs/day: 1.00    Years: 20.00    Pack years: 20.00    Types: Cigarettes    Quit date: 10/12/1992    Years since quitting: 27.7   Smokeless tobacco: Never Used  Vaping Use   Vaping Use: Never used  Substance and Sexual Activity   Alcohol use: Not Currently   Drug use: No   Sexual activity: Not on file  Other Topics Concern   Not on file  Social History Narrative   Not on file   Social Determinants of Health   Financial Resource Strain:    Difficulty of Paying Living Expenses: Not on file  Food Insecurity:    Worried About Charity fundraiser in the Last Year: Not on file   [employer] of Food in the Last Year: Not on file  Transportation Needs:    Lack of Transportation (Medical): Not on file   Lack of Transportation (Non-Medical): Not on file  Physical Activity:    Days of Exercise per Week: Not  on file   Minutes of Exercise per Session: Not on file  Stress:    Feeling of Stress : Not on file  Social Connections:    Frequency of Communication with Friends and Family: Not on file   Frequency of Social Gatherings with Friends and Family: Not on file   Attends Religious Services: Not on file   Active Member of Clubs or Organizations: Not on file   Attends Archivist Meetings: Not on file   Marital Status: Not on file  Intimate Partner Violence:    Fear of Current or Ex-Partner: Not on file   Emotionally Abused: Not on file   Physically Abused: Not on file   Sexually Abused: Not on file   Family History  Problem Relation Age of Onset   Emphysema Paternal Grandfather    Allergies Sister    Allergies Mother    Heart disease Mother    Kidney disease Mother    Arthritis Mother    Hypertension Mother    Pulmonary embolism Paternal Grandmother     Dementia Father    Hypertension Father     Objective: Office vital signs reviewed. BP 124/74    Pulse 64    Temp 97.9 F (36.6 C) (Temporal)    Ht 5\' 8"  (1.727 m)    Wt 188 lb (85.3 kg)    SpO2 97%    BMI 28.59 kg/m   Physical Examination:  General: Awake, alert, well nourished, No acute distress HEENT: Normal; no carotid bruits Cardio: regular rate and rhythm, S1S2 heard, no murmurs appreciated Pulm: clear to auscultation bilaterally, no wheezes, rhonchi or rales; normal work of breathing on room air Extremities: warm, well perfused, No edema, cyanosis or clubbing; +2 pulses bilaterally MSK: normal gait and station  Assessment/ Plan: 64 y.o. male   1. Essential hypertension Well-controlled.  Continue current regimen - amLODipine (NORVASC) 5 MG tablet; Take 1 tablet (5 mg total) by mouth daily.  Dispense: 90 tablet; Refill: 3  2. Mixed hyperlipidemia Not due for fasting lipid.  Continue statin - atorvastatin (LIPITOR) 40 MG tablet; Take 1 tablet (40 mg total) by mouth daily.  Dispense: 90 tablet; Refill: 3  3. Benign prostatic hyperplasia with urinary hesitancy Last PSA was normal.  I am repeating the PSA given new urinary symptoms as above.  This has been an add-on to his current labs.  Urinalysis with RBCs on urine dip but this was not present on urine microscopy.  Renal function normal.  No leukocytosis.  Urine culture was negative.  Will CC to Dr. Jeffie Pollock  4. Obstructive sleep apnea  5. Establishing care with new doctor, encounter for  6. Vitamin D deficiency History of deficiency but insufficiency noted on recent labs.  Recommended going back on vitamin D.  He will start today.   No orders of the defined types were placed in this encounter.  No orders of the defined types were placed in this encounter.    Janora Norlander, DO [place] [phone]

## 2020-07-02 LAB — PSA, TOTAL AND FREE
PSA, Free Pct: 10.6 %
PSA, Free: 0.18 ng/mL
Prostate Specific Ag, Serum: 1.7 ng/mL (ref 0.0–4.0)

## 2020-07-02 LAB — SPECIMEN STATUS REPORT

## 2020-12-27 ENCOUNTER — Ambulatory Visit: Payer: 59 | Admitting: Family Medicine

## 2021-01-22 ENCOUNTER — Other Ambulatory Visit: Payer: Self-pay

## 2021-01-22 ENCOUNTER — Ambulatory Visit (INDEPENDENT_AMBULATORY_CARE_PROVIDER_SITE_OTHER): Payer: 59 | Admitting: Family Medicine

## 2021-01-22 ENCOUNTER — Encounter: Payer: Self-pay | Admitting: Family Medicine

## 2021-01-22 VITALS — BP 102/72 | HR 66 | Temp 97.1°F | Ht 68.0 in | Wt 188.0 lb

## 2021-01-22 DIAGNOSIS — E782 Mixed hyperlipidemia: Secondary | ICD-10-CM

## 2021-01-22 DIAGNOSIS — E559 Vitamin D deficiency, unspecified: Secondary | ICD-10-CM

## 2021-01-22 DIAGNOSIS — I10 Essential (primary) hypertension: Secondary | ICD-10-CM

## 2021-01-22 NOTE — Progress Notes (Signed)
Subjective: CC: f/u HTN, HLD, Vit D def PCP: Cody Norlander, DO Cody:GBTD [name] is a 65 y.o. male presenting to clinic today for:  1. HTN w/ HLD Patient is compliant with amlodipine 5 mg each evening and atorvastatin 40 mg.  He notes that he recently started changing his diet and incorporating more fresh fruits and vegetables as his wife has been diagnosed with diabetes.  No reports of chest pain, shortness of breath, visual disturbance, edema  ROS: Per HPI  Allergies  Allergen Reactions  . Benadryl [Diphenhydramine Hcl]     Nerves "crawling" feelings  . Latex Other (See Comments)    Blisters   Past Medical History:  Diagnosis Date  . Blood clotting disorder ([place])    DVT/PE x2 Coumadin x 2 years 2006  . Colon cancer ([place])   . High cholesterol   . Hypertension   . Pulmonary embolism ([place])   . Sleep apnea     Current Outpatient Medications:  .  amLODipine (NORVASC) 5 MG tablet, Take 1 tablet (5 mg total) by mouth daily., Disp: 90 tablet, Rfl: 3 .  aspirin 81 MG tablet, Take 81 mg by mouth daily.  , Disp: , Rfl:  .  atorvastatin (LIPITOR) 40 MG tablet, Take 1 tablet (40 mg total) by mouth daily., Disp: 90 tablet, Rfl: 3 .  tamsulosin (FLOMAX) 0.4 MG CAPS capsule, Take 1 capsule (0.4 mg total) by mouth daily., Disp: 90 capsule, Rfl: 3 Social History   Socioeconomic History  . Marital status: Married    Spouse name: Not on file  . Number of children: 0  . Years of education: Not on file  . Highest education level: Not on file  Occupational History    Employer: [employer]  Tobacco Use  . Smoking status: Former Smoker    Packs/day: 1.00    Years: 20.00    Pack years: 20.00    Types: Cigarettes    Quit date: 10/12/1992    Years since quitting: 28.2  . Smokeless tobacco: Never Used  Vaping Use  . Vaping Use: Never used  Substance and Sexual Activity  . Alcohol use: Not Currently  . Drug use: No  . Sexual activity: Not on file  Other Topics Concern  . Not on file   Social History Narrative  . Not on file   Social Determinants of Health   Financial Resource Strain: Not on file  Food Insecurity: Not on file  Transportation Needs: Not on file  Physical Activity: Not on file  Stress: Not on file  Social Connections: Not on file  Intimate Partner Violence: Not on file   Family History  Problem Relation Age of Onset  . Emphysema Paternal Grandfather   . Allergies Sister   . Allergies Mother   . Heart disease Mother   . Kidney disease Mother   . Arthritis Mother   . Hypertension Mother   . Pulmonary embolism Paternal Grandmother   . Dementia Father   . Hypertension Father     Objective: Office vital signs reviewed. BP 102/72   Pulse 66   Temp (!) 97.1 F (36.2 C)   Ht $R'5\' 8"'eF$  (1.727 m)   Wt 188 lb (85.3 kg)   SpO2 96%   BMI 28.59 kg/m   Physical Examination:  General: Awake, alert, well nourished, No acute distress HEENT: No carotid bruits, sclera white Cardio: regular rate and rhythm, S1S2 heard, no murmurs appreciated Pulm: clear to auscultation bilaterally, no wheezes, rhonchi or rales;  normal work of breathing on room air GI: soft, non-tender, non-distended, bowel sounds present x4, no hepatomegaly, no splenomegaly, no masses Extremities: warm, well perfused, No edema, cyanosis or clubbing; +2 pulses bilaterally MSK: normal gait and station  Assessment/ Plan: 65 y.o. male   Essential hypertension - Plan: CMP14+EGFR  Mixed hyperlipidemia - Plan: CMP14+EGFR, Lipid Panel  Vitamin D deficiency - Plan: VITAMIN D 25 Hydroxy (Vit-D Deficiency, Fractures)  Is blood pressure actually is on the low side of my opinion.  Manual recheck still showed it to be on the low end of normal.  We discussed threshold for reinitiation of his amlodipine including a blood pressure above 140/90.  I advised him to hold his blood pressure medication today.  He will purchase a blood pressure cuff and monitor at home.  If blood pressure remains below  110/60 I do not recommend reinitiation of any blood pressure medication.  In fact we again discussed that threshold for reinitiation would be above 140/90.  He voiced good understanding of the plan.  Continue diet modification.  Check fasting lipid and CMP.  Vitamin D also collected given deficiency noted on last visit  No orders of the defined types were placed in this encounter.  No orders of the defined types were placed in this encounter.    Cody Norlander, DO [place] [phone]

## 2021-01-22 NOTE — Patient Instructions (Addendum)
You had labs performed today.  You will be contacted with the results of the labs once they are available, usually in the next 3 business days for routine lab work.  If you have an active my chart account, they will be released to your MyChart.  If you prefer to have these labs released to you via telephone, please let us know.   Hypotension As your heart beats, it forces blood through your body. This force is called blood pressure. If you have hypotension, you have low blood pressure. When your blood pressure is too low, you may not get enough blood to your brain or other parts of your body. This may cause you to feel weak, light-headed, have a fast heartbeat, or even pass out (faint). Low blood pressure may be harmless, or it may cause serious problems. What are the causes?  Blood loss.  Not enough water in the body (dehydration).  Heart problems.  Hormone problems.  Pregnancy.  A very bad infection.  Not having enough of certain nutrients.  Very bad allergic reactions.  Certain medicines. What increases the risk?  Age. The risk increases as you get older.  Conditions that affect the heart or the brain and spinal cord (central nervous system).  Taking certain medicines.  Being pregnant. What are the signs or symptoms?  Feeling: ? Weak. ? Light-headed. ? Dizzy. ? Tired (fatigued).  Blurred vision.  Fast heartbeat.  Passing out, in very bad cases. How is this treated?  Changing your diet. This may involve eating more salt (sodium) or drinking more water.  Taking medicines to raise your blood pressure.  Changing how much you take (the dosage) of some of your medicines.  Wearing compression stockings. These stockings help to prevent blood clots and reduce swelling in your legs. In some cases, you may need to go to the hospital for:  Fluid replacement. This means you will receive fluids through an IV tube.  Blood replacement. This means you will receive donated  blood through an IV tube (transfusion).  Treating an infection or heart problems, if this applies.  Monitoring. You may need to be monitored while medicines that you are taking wear off. Follow these instructions at home: Eating and drinking  Drink enough fluids to keep your pee (urine) pale yellow.  Eat a healthy diet. Follow instructions from your doctor about what you can eat or drink. A healthy diet includes: ? Fresh fruits and vegetables. ? Whole grains. ? Low-fat (lean) meats. ? Low-fat dairy products.  Eat extra salt only as told. Do not add extra salt to your diet unless your doctor tells you to.  Eat small meals often.  Avoid standing up quickly after you eat.   Medicines  Take over-the-counter and prescription medicines only as told by your doctor. ? Follow instructions from your doctor about changing how much you take of your medicines, if this applies. ? Do not stop or change any of your medicines on your own. General instructions  Wear compression stockings as told by your doctor.  Get up slowly from lying down or sitting.  Avoid hot showers and a lot of heat as told by your doctor.  Return to your normal activities as told by your doctor. Ask what activities are safe for you.  Do not use any products that contain nicotine or tobacco, such as cigarettes, e-cigarettes, and chewing tobacco. If you need help quitting, ask your doctor.  Keep all follow-up visits as told by your doctor. This  is important.   Contact a doctor if:  You throw up (vomit).  You have watery poop (diarrhea).  You have a fever for more than 2-3 days.  You feel more thirsty than normal.  You feel weak and tired. Get help right away if:  You have chest pain.  You have a fast or uneven heartbeat.  You lose feeling (have numbness) in any part of your body.  You cannot move your arms or your legs.  You have trouble talking.  You get sweaty or feel light-headed.  You pass  out.  You have trouble breathing.  You have trouble staying awake.  You feel mixed up (confused). Summary  Hypotension is also called low blood pressure. It is when the force of blood pumping through your arteries is too weak.  Hypotension may be harmless, or it may cause serious problems.  Treatment may include changing your diet and medicines, and wearing compression stockings.  In very bad cases, you may need to go to the hospital. This information is not intended to replace advice given to you by your health care provider. Make sure you discuss any questions you have with your health care provider. Document Revised: 03/24/2018 Document Reviewed: 03/24/2018 Elsevier Patient Education  [employer].

## 2021-01-23 LAB — CMP14+EGFR
ALT: 19 IU/L (ref 0–44)
AST: 19 IU/L (ref 0–40)
Albumin/Globulin Ratio: 2 (ref 1.2–2.2)
Albumin: 5 g/dL — ABNORMAL HIGH (ref 3.8–4.8)
Alkaline Phosphatase: 103 IU/L (ref 44–121)
BUN/Creatinine Ratio: 16 (ref 10–24)
BUN: 16 mg/dL (ref 8–27)
Bilirubin Total: 0.8 mg/dL (ref 0.0–1.2)
CO2: 21 mmol/L (ref 20–29)
Calcium: 9.7 mg/dL (ref 8.6–10.2)
Chloride: 101 mmol/L (ref 96–106)
Creatinine, Ser: 1 mg/dL (ref 0.76–1.27)
Globulin, Total: 2.5 g/dL (ref 1.5–4.5)
Glucose: 93 mg/dL (ref 65–99)
Potassium: 4.5 mmol/L (ref 3.5–5.2)
Sodium: 138 mmol/L (ref 134–144)
Total Protein: 7.5 g/dL (ref 6.0–8.5)
eGFR: 84 mL/min/{1.73_m2} (ref >59)

## 2021-01-23 LAB — LIPID PANEL
Chol/HDL Ratio: 4.1 ratio (ref 0.0–5.0)
Cholesterol, Total: 163 mg/dL (ref 100–199)
HDL: 40 mg/dL (ref >39)
LDL Chol Calc (NIH): 104 mg/dL — ABNORMAL HIGH (ref 0–99)
Triglycerides: 103 mg/dL (ref 0–149)
VLDL Cholesterol Cal: 19 mg/dL (ref 5–40)

## 2021-01-23 LAB — VITAMIN D 25 HYDROXY (VIT D DEFICIENCY, FRACTURES): Vit D, 25-Hydroxy: 27.1 ng/mL — ABNORMAL LOW (ref 30.0–100.0)

## 2021-04-08 ENCOUNTER — Ambulatory Visit (INDEPENDENT_AMBULATORY_CARE_PROVIDER_SITE_OTHER): Payer: 59

## 2021-04-08 ENCOUNTER — Other Ambulatory Visit: Payer: Self-pay

## 2021-04-08 ENCOUNTER — Ambulatory Visit (INDEPENDENT_AMBULATORY_CARE_PROVIDER_SITE_OTHER): Payer: 59 | Admitting: Nurse Practitioner

## 2021-04-08 ENCOUNTER — Encounter: Payer: Self-pay | Admitting: Nurse Practitioner

## 2021-04-08 DIAGNOSIS — M5489 Other dorsalgia: Secondary | ICD-10-CM

## 2021-04-08 DIAGNOSIS — M549 Dorsalgia, unspecified: Secondary | ICD-10-CM | POA: Insufficient documentation

## 2021-04-08 MED ORDER — DICLOFENAC SODIUM 75 MG PO TBEC: 75.0000 mg | DELAYED_RELEASE_TABLET | Freq: Two times a day (BID) | ORAL | 0 refills | Status: DC

## 2021-04-08 MED ORDER — PREDNISONE 10 MG (21) PO TBPK: ORAL_TABLET | ORAL | 0 refills | Status: DC

## 2021-04-08 NOTE — Assessment & Plan Note (Signed)
Recurrent lower back pain without sciatica in the last 4 weeks.  Patient tried heat with no therapeutic effect this is not new for patient, in the past he used diclofenac 75 mg tablet by mouth that worked to relieve pain symptoms.  Started patient on prednisone taper, reordered diclofenac 75 mg tablet by mouth, x-ray of lumbar.  Ice or warm compress as tolerated.,  Back strengthening exercises. Follow-up with worsening or unresolved symptoms.  Rx sent to pharmacy.

## 2021-04-08 NOTE — Patient Instructions (Signed)
Acute Back Pain, Adult Acute back pain is sudden and usually short-lived. It is often caused by an injury to the muscles and tissues in the back. The injury may result from: A muscle or ligament getting overstretched or torn (strained). Ligaments are tissues that connect bones to each other. Lifting something improperly can cause a back strain. Wear and tear (degeneration) of the spinal disks. Spinal disks are circular tissue that provide cushioning between the bones of the spine (vertebrae). Twisting motions, such as while playing sports or doing yard work. A hit to the back. Arthritis. You may have a physical exam, lab tests, and imaging tests to find the cause ofyour pain. Acute back pain usually goes away with rest and home care. Follow these instructions at home: Managing pain, stiffness, and swelling Treatment may include medicines for pain and inflammation that are taken by mouth or applied to the skin, prescription pain medicine, or muscle relaxants. Take over-the-counter and prescription medicines only as told by your health care provider. Your health care provider may recommend applying ice during the first 24-48 hours after your pain starts. To do this: Put ice in a plastic bag. Place a towel between your skin and the bag. Leave the ice on for 20 minutes, 2-3 times a day. If directed, apply heat to the affected area as often as told by your health care provider. Use the heat source that your health care provider recommends, such as a moist heat pack or a heating pad. Place a towel between your skin and the heat source. Leave the heat on for 20-30 minutes. Remove the heat if your skin turns bright red. This is especially important if you are unable to feel pain, heat, or cold. You have a greater risk of getting burned. Activity  Do not stay in bed. Staying in bed for more than 1-2 days can delay your recovery. Sit up and stand up straight. Avoid leaning forward when you sit or  hunching over when you stand. If you work at a desk, sit close to it so you do not need to lean over. Keep your chin tucked in. Keep your neck drawn back, and keep your elbows bent at a 90-degree angle (right angle). Sit high and close to the steering wheel when you drive. Add lower back (lumbar) support to your car seat, if needed. Take short walks on even surfaces as soon as you are able. Try to increase the length of time you walk each day. Do not sit, drive, or stand in one place for more than 30 minutes at a time. Sitting or standing for long periods of time can put stress on your back. Do not drive or use heavy machinery while taking prescription pain medicine. Use proper lifting techniques. When you bend and lift, use positions that put less stress on your back: Bend your knees. Keep the load close to your body. Avoid twisting. Exercise regularly as told by your health care provider. Exercising helps your back heal faster and helps prevent back injuries by keeping muscles strong and flexible. Work with a physical therapist to make a safe exercise program, as recommended by your health care provider. Do any exercises as told by your physical therapist.  Lifestyle Maintain a healthy weight. Extra weight puts stress on your back and makes it difficult to have good posture. Avoid activities or situations that make you feel anxious or stressed. Stress and anxiety increase muscle tension and can make back pain worse. Learn ways to manage   anxiety and stress, such as through exercise. General instructions Sleep on a firm mattress in a comfortable position. Try lying on your side with your knees slightly bent. If you lie on your back, put a pillow under your knees. Follow your treatment plan as told by your health care provider. This may include: Cognitive or behavioral therapy. Acupuncture or massage therapy. Meditation or yoga. Contact a health care provider if: You have pain that is not  relieved with rest or medicine. You have increasing pain going down into your legs or buttocks. Your pain does not improve after 2 weeks. You have pain at night. You lose weight without trying. You have a fever or chills. Get help right away if: You develop new bowel or bladder control problems. You have unusual weakness or numbness in your arms or legs. You develop nausea or vomiting. You develop abdominal pain. You feel faint. Summary Acute back pain is sudden and usually short-lived. Use proper lifting techniques. When you bend and lift, use positions that put less stress on your back. Take over-the-counter and prescription medicines and apply heat or ice as directed by your health care provider. This information is not intended to replace advice given to you by your health care provider. Make sure you discuss any questions you have with your healthcare provider. Document Revised: 06/18/2020 Document Reviewed: 06/21/2020 Elsevier Patient Education  2022 Elsevier Inc.  

## 2021-04-08 NOTE — Progress Notes (Signed)
Acute Office Visit  Subjective:    Patient ID: Cody Hernandez, male    DOB: 1956-01-29, 65 y.o.   MRN: [id number]  Chief Complaint  Patient presents with   Back Pain    Back Pain  Patient is in today for Pain  He reports recurrent lower back pain. was not an injury that may have caused the pain. The pain started a few weeks ago and is staying constant. The pain does not radiate . The pain is described as aching and soreness, is moderate in intensity, occurring intermittently. Symptoms are worse in the: mid-day  Aggravating factors: bending backwards, bending forwards, bending sideways, and walking Relieving factors: medication diclofenac 75 mg p.o. .  He has tried application of heat and NSAIDs with mild relief.   ---------------------------------------------------------------------------------------------------   Past Medical History:  Diagnosis Date   Blood clotting disorder ([facility])    DVT/PE x2 Coumadin x 2 years 2006   Colon cancer ([facility])    High cholesterol    Hypertension    Pulmonary embolism ([facility])    Sleep apnea     Past Surgical History:  Procedure Laterality Date   APPENDECTOMY  1977   COLON SURGERY  2007   neck/back surgery  1995 and 2010    Family History  Problem Relation Age of Onset   Emphysema Paternal Grandfather    Allergies Sister    Allergies Mother    Heart disease Mother    Kidney disease Mother    Arthritis Mother    Hypertension Mother    Pulmonary embolism Paternal Grandmother    Dementia Father    Hypertension Father     Social History   Socioeconomic History   Marital status: Married    Spouse name: Not on file   Number of children: 0   Years of education: Not on file   Highest education level: Not on file  Occupational History    Employer: [employer]  Tobacco Use   Smoking status: Former    Packs/day: 1.00    Years: 20.00    Pack years: 20.00    Types: Cigarettes    Quit date: 10/12/1992    Years since quitting: 28.5   Smokeless  tobacco: Never  Vaping Use   Vaping Use: Never used  Substance and Sexual Activity   Alcohol use: Not Currently   Drug use: No   Sexual activity: Not on file  Other Topics Concern   Not on file  Social History Narrative   Not on file   Social Determinants of Health   Financial Resource Strain: Not on file  Food Insecurity: Not on file  Transportation Needs: Not on file  Physical Activity: Not on file  Stress: Not on file  Social Connections: Not on file  Intimate Partner Violence: Not on file    Outpatient Medications Prior to Visit  Medication Sig Dispense Refill   amLODipine (NORVASC) 5 MG tablet Take 1 tablet (5 mg total) by mouth daily. 90 tablet 3   aspirin 81 MG tablet Take 81 mg by mouth daily.     atorvastatin (LIPITOR) 40 MG tablet Take 1 tablet (40 mg total) by mouth daily. 90 tablet 3   tamsulosin (FLOMAX) 0.4 MG CAPS capsule Take 1 capsule (0.4 mg total) by mouth daily. 90 capsule 3   No facility-administered medications prior to visit.    Allergies  Allergen Reactions   Benadryl [Diphenhydramine Hcl]     Nerves "crawling" feelings   Latex Other (See Comments)  Blisters    Review of Systems  Constitutional: Negative.   HENT: Negative.    Eyes: Negative.   Respiratory: Negative.    Cardiovascular: Negative.   Genitourinary: Negative.   Musculoskeletal:  Positive for back pain.  Skin:  Negative for rash.  All other systems reviewed and are negative.     Objective:    Physical Exam Vitals and nursing note reviewed.  Constitutional:      Appearance: Normal appearance.  HENT:     Head: Normocephalic.     Mouth/Throat:     Mouth: Mucous membranes are moist.     Pharynx: Oropharynx is clear.  Eyes:     Conjunctiva/sclera: Conjunctivae normal.  Cardiovascular:     Pulses: Normal pulses.     Heart sounds: Normal heart sounds.  Pulmonary:     Effort: Pulmonary effort is normal.     Breath sounds: Normal breath sounds.  Abdominal:      General: Bowel sounds are normal.  Musculoskeletal:     Lumbar back: Tenderness present. Decreased range of motion.       Back:  Neurological:     Mental Status: He is alert.    There were no vitals taken for this visit. Wt Readings from Last 3 Encounters:  01/22/21 188 lb (85.3 kg)  07/01/20 188 lb (85.3 kg)  12/18/19 190 lb (86.2 kg)    Health Maintenance Due  Topic Date Due   Pneumococcal Vaccine 22-4 Years old (1 - PCV) Never done   Zoster Vaccines- Shingrix (1 of 2) Never done   COVID-19 Vaccine (4 - Booster for Moderna series) 01/08/2021    There are no preventive care reminders to display for this patient.   Lab Results  Component Value Date   TSH 1.310 12/18/2019   Lab Results  Component Value Date   WBC 6.3 06/27/2020   HGB 14.4 06/27/2020   HCT 42.0 06/27/2020   MCV 87 06/27/2020   PLT 305 06/27/2020   Lab Results  Component Value Date   NA 138 01/22/2021   K 4.5 01/22/2021   CO2 21 01/22/2021   GLUCOSE 93 01/22/2021   BUN 16 01/22/2021   CREATININE 1.00 01/22/2021   BILITOT 0.8 01/22/2021   ALKPHOS 103 01/22/2021   AST 19 01/22/2021   ALT 19 01/22/2021   PROT 7.5 01/22/2021   ALBUMIN 5.0 (H) 01/22/2021   CALCIUM 9.7 01/22/2021   ANIONGAP 11 02/26/2018   EGFR 84 01/22/2021   Lab Results  Component Value Date   CHOL 163 01/22/2021   Lab Results  Component Value Date   HDL 40 01/22/2021   Lab Results  Component Value Date   LDLCALC 104 (H) 01/22/2021   Lab Results  Component Value Date   TRIG 103 01/22/2021   Lab Results  Component Value Date   CHOLHDL 4.1 01/22/2021   No results found for: HGBA1C     Assessment & Plan:   Problem List Items Addressed This Visit       Other   Back pain without sciatica - Primary    Recurrent lower back pain without sciatica in the last 4 weeks.  Patient tried heat with no therapeutic effect this is not new for patient, in the past he used diclofenac 75 mg tablet by mouth that worked to  relieve pain symptoms.  Started patient on prednisone taper, reordered diclofenac 75 mg tablet by mouth, x-ray of lumbar.  Ice or warm compress as tolerated.,  Back strengthening exercises. Follow-up with  worsening or unresolved symptoms.  Rx sent to pharmacy.        Relevant Medications   diclofenac (VOLTAREN) 75 MG EC tablet   predniSONE (STERAPRED UNI-PAK 21 TAB) 10 MG (21) TBPK tablet   Other Relevant Orders   DG Lumbar Spine 2-3 Views     Meds ordered this encounter  Medications   diclofenac (VOLTAREN) 75 MG EC tablet    Sig: Take 1 tablet (75 mg total) by mouth 2 (two) times daily.    Dispense:  60 tablet    Refill:  0    Order Specific Question:   Supervising Provider    Answer:   Janora Norlander [[id number]]   predniSONE (STERAPRED UNI-PAK 21 TAB) 10 MG (21) TBPK tablet    Sig: 6 tablet day one, 5 tablets day 2, 4 tablet day 3, 3 tablet day 4, 2 tablet day 5, 1 tablet day 6    Dispense:  1 each    Refill:  0    Order Specific Question:   Supervising Provider    Answer:   Janora Norlander [[id number]]     Ivy Lynn, NP

## 2021-04-09 ENCOUNTER — Telehealth: Payer: Self-pay | Admitting: *Deleted

## 2021-04-09 DIAGNOSIS — M5489 Other dorsalgia: Secondary | ICD-10-CM

## 2021-04-09 MED ORDER — DICLOFENAC SODIUM 75 MG PO TBEC: 75.0000 mg | DELAYED_RELEASE_TABLET | Freq: Two times a day (BID) | ORAL | 0 refills | Status: DC

## 2021-04-09 MED ORDER — PREDNISONE 10 MG (21) PO TBPK: ORAL_TABLET | ORAL | 0 refills | Status: DC

## 2021-04-09 NOTE — Telephone Encounter (Signed)
Pt needed yesterday's Rx's sent to [facility] instead of [facility] Sending in the prednisone & Diclofenac

## 2021-04-10 NOTE — Progress Notes (Signed)
Pt aware by detailed VM 

## 2021-04-11 ENCOUNTER — Telehealth: Payer: Self-pay | Admitting: Family Medicine

## 2021-04-11 DIAGNOSIS — M5489 Other dorsalgia: Secondary | ICD-10-CM

## 2021-04-11 MED ORDER — DICLOFENAC SODIUM 75 MG PO TBEC: 75.0000 mg | DELAYED_RELEASE_TABLET | Freq: Two times a day (BID) | ORAL | 0 refills | Status: DC

## 2021-04-11 NOTE — Telephone Encounter (Signed)
  Prescription Request  04/11/2021  What is the name of the medication or equipment? Diclofenac  Have you contacted your pharmacy to request a refill? (if applicable) yes  Which pharmacy would you like this sent to? [facility]   Patient notified that their request is being sent to the clinical staff for review and that they should receive a response within 2 business days.    Gottschalk's pt.  It was sent to wrong Pharmacy & he is going out-of-town.  Do not send to Mail Order.

## 2021-05-20 ENCOUNTER — Telehealth: Payer: Self-pay | Admitting: Family Medicine

## 2021-05-20 ENCOUNTER — Other Ambulatory Visit: Payer: Self-pay | Admitting: Family Medicine

## 2021-05-20 DIAGNOSIS — I10 Essential (primary) hypertension: Secondary | ICD-10-CM

## 2021-05-20 MED ORDER — TAMSULOSIN HCL 0.4 MG PO CAPS: 0.4000 mg | ORAL_CAPSULE | Freq: Every day | ORAL | 0 refills | Status: DC

## 2021-05-20 NOTE — Telephone Encounter (Signed)
Pt aware refill sent to pharmacy 

## 2021-05-20 NOTE — Telephone Encounter (Signed)
  Prescription Request  05/20/2021  What is the name of the medication or equipment? Tamsulosin  Have you contacted your pharmacy to request a refill? (if applicable) yes  Which pharmacy would you like this sent to? [facility]

## 2021-07-15 ENCOUNTER — Encounter: Payer: Self-pay | Admitting: Family Medicine

## 2021-07-15 ENCOUNTER — Ambulatory Visit (INDEPENDENT_AMBULATORY_CARE_PROVIDER_SITE_OTHER): Payer: 59 | Admitting: Family Medicine

## 2021-07-15 ENCOUNTER — Other Ambulatory Visit: Payer: Self-pay

## 2021-07-15 VITALS — BP 119/74 | HR 70 | Temp 97.7°F | Ht 68.0 in | Wt 192.0 lb

## 2021-07-15 DIAGNOSIS — E559 Vitamin D deficiency, unspecified: Secondary | ICD-10-CM | POA: Diagnosis not present

## 2021-07-15 DIAGNOSIS — N401 Enlarged prostate with lower urinary tract symptoms: Secondary | ICD-10-CM

## 2021-07-15 DIAGNOSIS — E782 Mixed hyperlipidemia: Secondary | ICD-10-CM | POA: Diagnosis not present

## 2021-07-15 DIAGNOSIS — R3911 Hesitancy of micturition: Secondary | ICD-10-CM

## 2021-07-15 DIAGNOSIS — E8881 Metabolic syndrome: Secondary | ICD-10-CM

## 2021-07-15 DIAGNOSIS — I10 Essential (primary) hypertension: Secondary | ICD-10-CM

## 2021-07-15 DIAGNOSIS — Z23 Encounter for immunization: Secondary | ICD-10-CM

## 2021-07-15 DIAGNOSIS — G4733 Obstructive sleep apnea (adult) (pediatric): Secondary | ICD-10-CM

## 2021-07-15 DIAGNOSIS — J301 Allergic rhinitis due to pollen: Secondary | ICD-10-CM

## 2021-07-15 NOTE — Patient Instructions (Signed)
The 4 Principles of Healthy Living  1. Do Not Smoke.  2. Maintain a BMI<30.  3. Exercise 150 minutes/week. (30 minutes 5 days a week of some form of aerobic exercise)  4. Eat 5 servings of fruits or vegetables daily.   Several studies have conclusively shown that individuals who do these things have a dramatic reduction in overall mortality, heart disease, diabetes, hypertension, stroke, congestive heart failure, and cancer. This means that without taking any pills, vitamins, tonics, etc - without spending a single penny - you can live a longer, healthier, more productive life. Let's look at these 4 principles separately.   1. Do Not Smoke - Make up your mind to quit, talk with your doctor to formulate a plan, and set a date.  2. Get to and maintain a BMI<30. This gets you out of the obese category. No longer being obese will dramatically reduce you and your family's risk of a multitude of health problems. It is not easy, but, it is possible. If you are extremely obese it may take years, but, each step you take will lead to dramatic rewards. With just 20 pounds of weight loss most people feel better, have less fatigue and joint pain, and feel more energetic. If you have health problems like diabetes, hypertension, or high cholesterol you might do away with your need for some medications. And most of all, while you change you lifestyle to attain this goal you will set a good example for all those around you, especially your children. Here are two proven steps to help you start losing weight. ? Portion Control - Eating your meals on a smaller plate and limiting the amount of calories you eat at each meal has been shown to lead to weight loss. The average plate is 10 inches in diameter. A 10 inch plate piled high with food can add up to 1500 calories (even more if you go back for seconds). The typical man needs 2000 calories per day total. Try using a smaller plate (8 inch paper plate or 7 inch saucer)  at each meal and NEVER go back for second helpings. ? Pedometer - individuals who wear a pedometer and try to walk 10,000 steps each day increase their physical activity, lose weight, and decrease their blood pressures.  3. Exercise 150 minutes/week. The overall health benefits of regular aerobic exercise are overwhelming:  Reduces the risk of dying prematurely. Reduces the risk of dying from heart disease.  Reduces the risk of stroke.  Reduces the risk of developing diabetes.  Reduces the risk of developing high blood pressure.  Helps reduce blood pressure in people who already have high blood pressure.  Reduces the risk of developing colon cancer.  Reduces feelings of depression and anxiety.  Helps control weight.  Helps build and maintain healthy bones, muscles and joints.  Helps older adults become stronger and better able to move about without falling.  Promotes psychological well-being.  If you have health problems or are over age 60 we recommend consulting your doctor before beginning. We recommend starting slow and working up. Start by reading the [employer]note on Starting an Exercise Program then get going. Do not over think the process. Pick something simple at home like walking with friends, using a treadmill, or riding an exercise bike. Experiment with different types of exercise until you find something you can tolerate (it does not have to be fun). Pick a 30 minute disc of inspirational music and listen to it while you exercise.   The more you do it the easier it will become and the better you will feel.  4. Eat 5 servings of fruits or vegetables daily.  A serving size is: One medium-size fruit  1/2 cup raw, cooked, frozen or canned fruits (in 100% juice) or vegetables  3/4 cup (6 oz.) 100% fruit or vegetable juice  1 cup raw, leafy vegetables  1/4 cup dried fruit   While this sounds easy enough actually getting this much fruits and vegetables takes some work and planning. You  will need to experiment with different types of fruits and vegetables to find ones you and your family can eat every day. Some simple tips include:  - Add fruit to your cereal each morning. - Eat a salad each day for lunch. The typical bowl of salad counts for 2 servings of vegetables.  - Have some fruits and vegetables at every meal. Use canned or frozen products if needed. - Eat fruits and vegetables for snacks - especially for the kids. - Replace the side of fries or chips with a cup of fruit, an apple, or a bowl of celery.  What are the benefits? Reduces heart disease and stroke. Possible reduction in cancer risk. Protects against the development of diabetes. Filling up on fat free fruits and vegetable decreases the amount of high fat foods you will eat, aiding in weight loss.  

## 2021-07-15 NOTE — Progress Notes (Addendum)
Subjective:  Patient ID: Cody Hernandez, male    DOB: 07-15-1956, 65 y.o.   MRN: [id number]  Patient Care Team: Janora Norlander, DO as PCP - General (Family Medicine)   Chief Complaint:  Hypertension (Nasal congestion (allergies?)/B/P, currently under control/asking for labs) and Medical Management of Chronic Issues   HPI: Cody Hernandez is a 64 y.o. male presenting on 07/15/2021 for Hypertension (Nasal congestion (allergies?)/B/P, currently under control/asking for labs) and Medical Management of Chronic Issues  Pt presents today for management of chronic medical conditions. He also reports nasal congestion and rhinorrhea. States this can be worse during season changes. He denies fever, chills, weakness, sore throat, cough, or sputum production. No known sick exposures.   1. Essential hypertension He has been more compliant with his CPAP and has noticed better control of BP. He is taking Norvasc as prescribed without adverse side effects. He denies chest pain, visual changes, headaches, or edema.   2. Mixed hyperlipidemia Pt is compliant with statin therapy and denies myalgias. He does try to follow a healthy diet. He does not exercise on a regular basis.   3. Vitamin D deficiency Not taking repletion therapy as discussed. States he was taking and then ran out and has not restarted therapy. He denies worsening arthralgias.   4. Obstructive sleep apnea Has been more compliant with CPAP and has noticed a great improvement in energy level and BP control. Tolerates CPAP well when using.   5. Metabolic syndrome Does try to follow a healthy diet but does not exercise on a regular basis.   6. Benign prostatic hyperplasia with urinary hesitancy Doing fairly well on Flomax. No worsening symptoms but does continue to have hesitancy.      Relevant past medical, surgical, family, and social history reviewed and updated as indicated.  Allergies and medications reviewed and updated. Data  reviewed: Chart in Epic.   Past Medical History:  Diagnosis Date   Blood clotting disorder ([place])    DVT/PE x2 Coumadin x 2 years 2006   Colon cancer ([facility])    High cholesterol    Hypertension    Pulmonary embolism ([facility])    Sleep apnea     Past Surgical History:  Procedure Laterality Date   APPENDECTOMY  1977   COLON SURGERY  2007   neck/back surgery  1995 and 2010    Social History   Socioeconomic History   Marital status: Married    Spouse name: Not on file   Number of children: 0   Years of education: Not on file   Highest education level: Not on file  Occupational History    Employer: [employer]  Tobacco Use   Smoking status: Former    Packs/day: 1.00    Years: 20.00    Pack years: 20.00    Types: Cigarettes    Quit date: 10/12/1992    Years since quitting: 28.7   Smokeless tobacco: Never  Vaping Use   Vaping Use: Never used  Substance and Sexual Activity   Alcohol use: Not Currently   Drug use: No   Sexual activity: Not on file  Other Topics Concern   Not on file  Social History Narrative   Not on file   Social Determinants of Health   Financial Resource Strain: Not on file  Food Insecurity: Not on file  Transportation Needs: Not on file  Physical Activity: Not on file  Stress: Not on file  Social Connections: Not on file  Intimate  Partner Violence: Not on file    Outpatient Encounter Medications as of 07/15/2021  Medication Sig   amLODipine (NORVASC) 5 MG tablet TAKE 1 TABLET DAILY   aspirin 81 MG tablet Take 81 mg by mouth daily.   atorvastatin (LIPITOR) 40 MG tablet Take 1 tablet (40 mg total) by mouth daily.   diclofenac (VOLTAREN) 75 MG EC tablet Take 1 tablet (75 mg total) by mouth 2 (two) times daily.   tamsulosin (FLOMAX) 0.4 MG CAPS capsule Take 1 capsule (0.4 mg total) by mouth daily.   [DISCONTINUED] predniSONE (STERAPRED UNI-PAK 21 TAB) 10 MG (21) TBPK tablet 6 tablet day one, 5 tablets day 2, 4 tablet day 3, 3 tablet day 4, 2 tablet day 5, 1  tablet day 6   No facility-administered encounter medications on file as of 07/15/2021.    Allergies  Allergen Reactions   Benadryl [Diphenhydramine Hcl]     Nerves "crawling" feelings   Latex Other (See Comments)    Blisters    Review of Systems  Constitutional:  Negative for activity change, appetite change, chills, diaphoresis, fatigue, fever and unexpected weight change.  HENT:  Positive for congestion and postnasal drip.   Eyes: Negative.  Negative for photophobia and visual disturbance.  Respiratory:  Negative for cough and chest tightness.   Cardiovascular:  Negative for leg swelling.  Gastrointestinal:  Negative for abdominal pain, blood in stool, constipation, diarrhea, nausea and vomiting.  Endocrine: Negative.  Negative for cold intolerance, heat intolerance, polydipsia, polyphagia and polyuria.  Genitourinary:  Positive for difficulty urinating (hesistancy). Negative for decreased urine volume, dysuria, enuresis, flank pain, frequency, genital sores, hematuria, penile discharge, penile pain, penile swelling, scrotal swelling, testicular pain and urgency.  Musculoskeletal:  Negative for arthralgias and myalgias.  Skin: Negative.   Allergic/Immunologic: Negative.   Neurological:  Negative for dizziness, tremors, seizures, syncope, facial asymmetry, speech difficulty, weakness, light-headedness and numbness.  Hematological: Negative.   Psychiatric/Behavioral:  Negative for confusion, hallucinations, sleep disturbance and suicidal ideas.   All other systems reviewed and are negative.      Objective:  BP 119/74   Pulse 70   Temp 97.7 F (36.5 C)   Ht $R'5\' 8"'bZ$  (1.727 m)   Wt 192 lb (87.1 kg)   SpO2 98%   BMI 29.19 kg/m    Wt Readings from Last 3 Encounters:  07/15/21 192 lb (87.1 kg)  01/22/21 188 lb (85.3 kg)  07/01/20 188 lb (85.3 kg)    Physical Exam Vitals and nursing note reviewed.  Constitutional:      General: He is not in acute distress.    Appearance:  Normal appearance. He is well-developed, well-groomed and overweight. He is not ill-appearing, toxic-appearing or diaphoretic.  HENT:     Head: Normocephalic and atraumatic.     Jaw: There is normal jaw occlusion.     Right Ear: Hearing, ear canal and external ear normal. A middle ear effusion is present. Tympanic membrane is not injected, perforated, erythematous or retracted.     Left Ear: Hearing, ear canal and external ear normal. A middle ear effusion is present. Tympanic membrane is not injected, perforated, erythematous or retracted.     Nose: Congestion and rhinorrhea present. Rhinorrhea is clear.     Mouth/Throat:     Lips: Pink.     Mouth: Mucous membranes are moist.     Pharynx: Oropharynx is clear. Uvula midline.  Eyes:     General: Lids are normal.     Extraocular Movements: Extraocular  movements intact.     Conjunctiva/sclera: Conjunctivae normal.     Pupils: Pupils are equal, round, and reactive to light.  Neck:     Thyroid: No thyroid mass, thyromegaly or thyroid tenderness.     Vascular: No carotid bruit or JVD.     Trachea: Trachea and phonation normal.  Cardiovascular:     Rate and Rhythm: Normal rate and regular rhythm.     Chest Wall: PMI is not displaced.     Pulses: Normal pulses.     Heart sounds: Normal heart sounds. No murmur heard.   No friction rub. No gallop.  Pulmonary:     Effort: Pulmonary effort is normal. No respiratory distress.     Breath sounds: Normal breath sounds. No wheezing.  Abdominal:     General: Bowel sounds are normal. There is no distension or abdominal bruit.     Palpations: Abdomen is soft. There is no hepatomegaly or splenomegaly.     Tenderness: There is no abdominal tenderness. There is no right CVA tenderness or left CVA tenderness.     Hernia: No hernia is present.  Musculoskeletal:        General: Normal range of motion.     Cervical back: Normal range of motion and neck supple.     Right lower leg: No edema.     Left lower  leg: No edema.  Lymphadenopathy:     Cervical: No cervical adenopathy.  Skin:    General: Skin is warm and dry.     Capillary Refill: Capillary refill takes less than 2 seconds.     Coloration: Skin is not cyanotic, jaundiced or pale.     Findings: No rash.  Neurological:     General: No focal deficit present.     Mental Status: He is alert and oriented to person, place, and time.     Cranial Nerves: Cranial nerves are intact. No cranial nerve deficit.     Sensory: Sensation is intact. No sensory deficit.     Motor: Motor function is intact. No weakness.     Coordination: Coordination is intact. Coordination normal.     Gait: Gait is intact. Gait normal.     Deep Tendon Reflexes: Reflexes are normal and symmetric. Reflexes normal.  Psychiatric:        Attention and Perception: Attention and perception normal.        Mood and Affect: Mood and affect normal.        Speech: Speech normal.        Behavior: Behavior normal. Behavior is cooperative.        Thought Content: Thought content normal.        Cognition and Memory: Cognition and memory normal.        Judgment: Judgment normal.    Results for orders placed or performed in visit on 01/22/21  CMP14+EGFR  Result Value Ref Range   Glucose 93 65 - 99 mg/dL   BUN 16 8 - 27 mg/dL   Creatinine, Ser 1.00 0.76 - 1.27 mg/dL   eGFR 84 >59 mL/min/1.73   BUN/Creatinine Ratio 16 10 - 24   Sodium 138 134 - 144 mmol/L   Potassium 4.5 3.5 - 5.2 mmol/L   Chloride 101 96 - 106 mmol/L   CO2 21 20 - 29 mmol/L   Calcium 9.7 8.6 - 10.2 mg/dL   Total Protein 7.5 6.0 - 8.5 g/dL   Albumin 5.0 (H) 3.8 - 4.8 g/dL   Globulin, Total 2.5 1.5 -  4.5 g/dL   Albumin/Globulin Ratio 2.0 1.2 - 2.2   Bilirubin Total 0.8 0.0 - 1.2 mg/dL   Alkaline Phosphatase 103 44 - 121 IU/L   AST 19 0 - 40 IU/L   ALT 19 0 - 44 IU/L  Lipid Panel  Result Value Ref Range   Cholesterol, Total 163 100 - 199 mg/dL   Triglycerides 408 0 - 149 mg/dL   HDL 40 >20 mg/dL    VLDL Cholesterol Cal 19 5 - 40 mg/dL   LDL Chol Calc (NIH) 649 (H) 0 - 99 mg/dL   Chol/HDL Ratio 4.1 0.0 - 5.0 ratio  VITAMIN D 25 Hydroxy (Vit-D Deficiency, Fractures)  Result Value Ref Range   Vit D, 25-Hydroxy 27.1 (L) 30.0 - 100.0 ng/mL       Pertinent labs & imaging results that were available during my care of the patient were reviewed by me and considered in my medical decision making.  Assessment & Plan:  Binh was seen today for hypertension and medical management of chronic issues.  Diagnoses and all orders for this visit:  Essential hypertension BP well controlled. Changes were not made in regimen today. Goal BP is 130/80. Pt aware to report any persistent high or low readings. DASH diet and exercise encouraged. Exercise at least 150 minutes per week and increase as tolerated. Goal BMI > 25. Stress management encouraged. Avoid nicotine and tobacco product use. Avoid excessive alcohol and NSAID's. Avoid more than 2000 mg of sodium daily. Medications as prescribed. Follow up as scheduled.  -     CMP14+EGFR -     CBC with Differential/Platelet -     Lipid panel -     Thyroid Panel With TSH -     Microalbumin / creatinine urine ratio  Mixed hyperlipidemia Diet encouraged - increase intake of fresh fruits and vegetables, increase intake of lean proteins. Bake, broil, or grill foods. Avoid fried, greasy, and fatty foods. Avoid fast foods. Increase intake of fiber-rich whole grains. Exercise encouraged - at least 150 minutes per week and advance as tolerated.  Goal BMI < 25. Continue medications as prescribed. Follow up in 3-6 months as discussed.  -     Lipid panel  Vitamin D deficiency Labs pending. Restart repletion therapy. If indicated, will change repletion dosage. Eat foods rich in Vit D including milk, orange juice, yogurt with vitamin D added, salmon or mackerel, canned tuna fish, cereals with vitamin D added, and cod liver oil. Get out in the sun but make sure to wear at  least SPF 30 sunscreen.  -     VITAMIN D 25 Hydroxy (Vit-D Deficiency, Fractures)  Obstructive sleep apnea Continued compliance with CPAP encouraged. Will need machine and supplies.   Metabolic syndrome Diet and exercise encouraged. Labs pending.  -     CMP14+EGFR -     CBC with Differential/Platelet -     Lipid panel -     Thyroid Panel With TSH -     Microalbumin / creatinine urine ratio  Benign prostatic hyperplasia with urinary hesitancy Continue Flomax. Will check PSA today.  -     PSA, total and free  Seasonal allergic rhinitis due to pollen Symptomatic care discussed in detail. Flonase daily recommended. Report any new, worsening, or persistent symptoms.   Influenza vaccination given today.   Continue all other maintenance medications.  Follow up plan: Return in about 6 months (around 01/13/2022), or if symptoms worsen or fail to improve, for HTN, HLD.  Continue healthy lifestyle choices, including diet (rich in fruits, vegetables, and lean proteins, and low in salt and simple carbohydrates) and exercise (at least 30 minutes of moderate physical activity daily).  Educational handout given for healthy living  The above assessment and management plan was discussed with the patient. The patient verbalized understanding of and has agreed to the management plan. Patient is aware to call the clinic if they develop any new symptoms or if symptoms persist or worsen. Patient is aware when to return to the clinic for a follow-up visit. Patient educated on when it is appropriate to go to the emergency department.   Monia Pouch, FNP-C [facility] [phone]

## 2021-07-16 LAB — CMP14+EGFR
ALT: 21 IU/L (ref 0–44)
AST: 23 IU/L (ref 0–40)
Albumin/Globulin Ratio: 1.8 (ref 1.2–2.2)
Albumin: 4.6 g/dL (ref 3.8–4.8)
Alkaline Phosphatase: 88 IU/L (ref 44–121)
BUN/Creatinine Ratio: 11 (ref 10–24)
BUN: 10 mg/dL (ref 8–27)
Bilirubin Total: 0.5 mg/dL (ref 0.0–1.2)
CO2: 23 mmol/L (ref 20–29)
Calcium: 9.7 mg/dL (ref 8.6–10.2)
Chloride: 101 mmol/L (ref 96–106)
Creatinine, Ser: 0.93 mg/dL (ref 0.76–1.27)
Globulin, Total: 2.5 g/dL (ref 1.5–4.5)
Glucose: 100 mg/dL — ABNORMAL HIGH (ref 70–99)
Potassium: 4.3 mmol/L (ref 3.5–5.2)
Sodium: 140 mmol/L (ref 134–144)
Total Protein: 7.1 g/dL (ref 6.0–8.5)
eGFR: 92 mL/min/{1.73_m2} (ref >59)

## 2021-07-16 LAB — THYROID PANEL WITH TSH
Free Thyroxine Index: 1.9 (ref 1.2–4.9)
T3 Uptake Ratio: 27 % (ref 24–39)
T4, Total: 7 ug/dL (ref 4.5–12.0)
TSH: 1.57 u[IU]/mL (ref 0.450–4.500)

## 2021-07-16 LAB — CBC WITH DIFFERENTIAL/PLATELET
Basophils Absolute: 0 10*3/uL (ref 0.0–0.2)
Basos: 1 %
EOS (ABSOLUTE): 0.1 10*3/uL (ref 0.0–0.4)
Eos: 1 %
Hematocrit: 42.7 % (ref 37.5–51.0)
Hemoglobin: 14.3 g/dL (ref 13.0–17.7)
Immature Grans (Abs): 0 10*3/uL (ref 0.0–0.1)
Immature Granulocytes: 0 %
Lymphocytes Absolute: 2 10*3/uL (ref 0.7–3.1)
Lymphs: 33 %
MCH: 29 pg (ref 26.6–33.0)
MCHC: 33.5 g/dL (ref 31.5–35.7)
MCV: 87 fL (ref 79–97)
Monocytes Absolute: 0.4 10*3/uL (ref 0.1–0.9)
Monocytes: 7 %
Neutrophils Absolute: 3.4 10*3/uL (ref 1.4–7.0)
Neutrophils: 58 %
Platelets: 289 10*3/uL (ref 150–450)
RBC: 4.93 x10E6/uL (ref 4.14–5.80)
RDW: 13 % (ref 11.6–15.4)
WBC: 5.9 10*3/uL (ref 3.4–10.8)

## 2021-07-16 LAB — LIPID PANEL
Chol/HDL Ratio: 3.7 ratio (ref 0.0–5.0)
Cholesterol, Total: 144 mg/dL (ref 100–199)
HDL: 39 mg/dL — ABNORMAL LOW (ref >39)
LDL Chol Calc (NIH): 83 mg/dL (ref 0–99)
Triglycerides: 121 mg/dL (ref 0–149)
VLDL Cholesterol Cal: 22 mg/dL (ref 5–40)

## 2021-07-16 LAB — PSA, TOTAL AND FREE
PSA, Free Pct: 12.2 %
PSA, Free: 0.22 ng/mL
Prostate Specific Ag, Serum: 1.8 ng/mL (ref 0.0–4.0)

## 2021-07-16 LAB — MICROALBUMIN / CREATININE URINE RATIO
Creatinine, Urine: 129 mg/dL
Microalb/Creat Ratio: <2 mg/g creat (ref 0–29)
Microalbumin, Urine: <3 ug/mL

## 2021-07-16 LAB — VITAMIN D 25 HYDROXY (VIT D DEFICIENCY, FRACTURES): Vit D, 25-Hydroxy: 29.1 ng/mL — ABNORMAL LOW (ref 30.0–100.0)

## 2021-07-24 ENCOUNTER — Encounter: Payer: 59 | Admitting: Family Medicine

## 2021-09-11 ENCOUNTER — Other Ambulatory Visit: Payer: Self-pay | Admitting: Family Medicine

## 2021-09-11 DIAGNOSIS — E782 Mixed hyperlipidemia: Secondary | ICD-10-CM

## 2021-09-11 DIAGNOSIS — I10 Essential (primary) hypertension: Secondary | ICD-10-CM

## 2021-09-22 ENCOUNTER — Telehealth: Payer: Self-pay | Admitting: Family Medicine

## 2021-09-22 DIAGNOSIS — G4733 Obstructive sleep apnea (adult) (pediatric): Secondary | ICD-10-CM

## 2021-09-22 NOTE — Telephone Encounter (Signed)
Can you sign pending order for new CPAP machine and add settings to order, what I saw was a pressure of 9, machine is over 31 ys old and it started messing up on him last evening.

## 2021-09-24 NOTE — Telephone Encounter (Signed)
Pt calling to check on CPAP machine. Please call back and advise.

## 2022-01-05 ENCOUNTER — Encounter: Payer: 59 | Admitting: Family Medicine

## 2022-01-09 ENCOUNTER — Telehealth: Payer: Self-pay | Admitting: Family Medicine

## 2022-01-12 NOTE — Telephone Encounter (Signed)
lmtcb

## 2022-01-12 NOTE — Telephone Encounter (Signed)
Was there something specific he wanted tested?  He just had all labs done in 07/2021 and nothing is technically due until 07/2022 ?

## 2022-01-12 NOTE — Telephone Encounter (Signed)
Pt returned missed call. Pt said that he was not having any issues. He just thought he remembered someone telling him to come back in April to have his labs rechecked.  ? ?Pt will wait until October to have his labs rechecked. ?

## 2022-02-26 ENCOUNTER — Other Ambulatory Visit: Payer: Self-pay | Admitting: Family Medicine

## 2022-02-26 DIAGNOSIS — I10 Essential (primary) hypertension: Secondary | ICD-10-CM

## 2022-02-26 DIAGNOSIS — E782 Mixed hyperlipidemia: Secondary | ICD-10-CM

## 2022-02-26 NOTE — Telephone Encounter (Signed)
Cody Hernandez needs 6 month checkup mail not sent

## 2022-02-26 NOTE — Telephone Encounter (Signed)
Patient is currently in [place] and he is unable to come in for an appointment. He stated that he was told in April that he did not need to have any labs or an appointment. Please call back and advise.

## 2022-03-26 ENCOUNTER — Telehealth: Payer: Self-pay | Admitting: Family Medicine

## 2022-04-07 ENCOUNTER — Ambulatory Visit: Payer: 59 | Admitting: Family Medicine

## 2022-04-22 ENCOUNTER — Other Ambulatory Visit: Payer: Self-pay | Admitting: Family Medicine

## 2022-08-20 ENCOUNTER — Telehealth: Payer: Self-pay | Admitting: Family Medicine

## 2022-08-21 NOTE — Telephone Encounter (Signed)
Lmtcb.

## 2022-08-25 ENCOUNTER — Encounter: Payer: Self-pay | Admitting: Nurse Practitioner

## 2022-08-25 ENCOUNTER — Ambulatory Visit (INDEPENDENT_AMBULATORY_CARE_PROVIDER_SITE_OTHER): Payer: 59 | Admitting: Nurse Practitioner

## 2022-08-25 VITALS — BP 109/71 | HR 66 | Temp 98.2°F | Resp 20 | Ht 68.0 in | Wt 185.0 lb

## 2022-08-25 DIAGNOSIS — L570 Actinic keratosis: Secondary | ICD-10-CM | POA: Diagnosis not present

## 2022-08-25 NOTE — Telephone Encounter (Signed)
Patient came in to see <<<mmm>>>

## 2022-08-25 NOTE — Progress Notes (Signed)
   Subjective:    Patient ID: Cody Hernandez, male    DOB: 1956-06-22, 66 y.o.   MRN: [id number]   Chief Complaint: Spot in middle of back   HPI Patient  has a spot in middle of his back that has been itching. He has scratched at area and it has gone away but now has come back.    Review of Systems  Constitutional:  Negative for diaphoresis.  Eyes:  Negative for pain.  Respiratory:  Negative for shortness of breath.   Cardiovascular:  Negative for chest pain, palpitations and leg swelling.  Gastrointestinal:  Negative for abdominal pain.  Endocrine: Negative for polydipsia.  Skin:  Negative for rash.  Neurological:  Negative for dizziness, weakness and headaches.  Hematological:  Does not bruise/bleed easily.  All other systems reviewed and are negative.      Objective:   Physical Exam Vitals and nursing note reviewed.  Constitutional:      Appearance: Normal appearance. He is well-developed.  Neck:     Thyroid: No thyroid mass or thyromegaly.     Vascular: No carotid bruit or JVD.     Trachea: Phonation normal.  Cardiovascular:     Rate and Rhythm: Normal rate and regular rhythm.  Pulmonary:     Effort: Pulmonary effort is normal. No respiratory distress.     Breath sounds: Normal breath sounds.  Abdominal:     General: Bowel sounds are normal.     Palpations: Abdomen is soft.     Tenderness: There is no abdominal tenderness.  Musculoskeletal:        General: Normal range of motion.     Cervical back: Normal range of motion and neck supple.  Lymphadenopathy:     Cervical: No cervical adenopathy.  Skin:    General: Skin is warm and dry.     Comments: Flesh colored 3cm raised lesion in mid upper back  Neurological:     Mental Status: He is alert and oriented to person, place, and time.  Psychiatric:        Behavior: Behavior normal.        Thought Content: Thought content normal.        Judgment: Judgment normal.     BP 109/71   Pulse 66   Temp 98.2 F  (36.8 C) (Temporal)   Resp 20   Ht '5\' 8"'$  (1.727 m)   Wt 185 lb (83.9 kg)   SpO2 96%   BMI 28.13 kg/m        Assessment & Plan:   Skip Mayer in today with chief complaint of Spot in middle of back   1. Keratosis Keep clean and dry Do not  pick or scratch Report any signs of infection- red- painful or draining RTOPrn   The above assessment and management plan was discussed with the patient. The patient verbalized understanding of and has agreed to the management plan. Patient is aware to call the clinic if symptoms persist or worsen. Patient is aware when to return to the clinic for a follow-up visit. Patient educated on when it is appropriate to go to the emergency department.   Mary-Margaret Hassell Done, FNP

## 2022-08-25 NOTE — Patient Instructions (Signed)
Cryoablation Cryoablation is a procedure to get rid of abnormal growths or cancerous tissue. This is done by freezing the growth or tissue with liquid nitrogen or argon gas. This procedure is also known as cryotherapy or cryosurgery. It may be done to treat: Skin tumors. These are abnormal growths of cells on the skin. Knots of tissue called nodules that are benign. This means that they are not cancerous. Retinoblastoma. This is a type of eye cancer. Cancers of the prostate, liver, kidney, cervix, lung, and bone. Tell a health care provider about: Any allergies you have. All medicines you are taking, including vitamins, herbs, eye drops, creams, and over-the-counter medicines. Any problems you or family members have had with anesthesia. Any bleeding problems you have. Any surgeries you have had. Any medical conditions you have. Whether you are pregnant or may be pregnant. What are the risks? Your health care provider will talk with you about risks. These may include: Infection. Bleeding. Swelling. Allergic reactions to medicines. Damage to nearby structures or organs. In rare cases, there may be damage to nerves. This can cause numbness. What happens before the procedure? When to stop eating and drinking Follow instructions from your health care provider about what you may eat and drink. These may include: 8 hours before your procedure Stop eating most foods. Do not eat meat, fried foods, or fatty foods. Eat only light foods, such as toast or crackers. All liquids are okay except energy drinks and alcohol. 6 hours before your procedure Stop eating. Drink only clear liquids, such as water, clear fruit juice, black coffee, plain tea, and sports drinks. Do not drink energy drinks or alcohol. 2 hours before your procedure Stop drinking all liquids. You may be allowed to take medicines with small sips of water. If you do not follow your health care provider's instructions, your  procedure may be delayed or canceled. Medicines Ask your health care provider about: Changing or stopping your regular medicines. These include any diabetes medicines or blood thinners you take. Taking medicines such as aspirin and ibuprofen. These medicines can thin your blood. Do not take them unless your health care provider tells you to. Taking over-the-counter medicines, vitamins, herbs, and supplements. Tests A medical history will be taken. You may also have an exam and testing. Tests may include: Blood tests. Imaging tests. Surgery safety Ask your health care provider: How your surgery site will be marked. What steps will be taken to help prevent infection. These may include: Removing hair at the surgery site. Washing skin with a soap that kills germs. Receiving antibiotics. General instructions Do not use any products that contain nicotine or tobacco for at least 4 weeks before the procedure. These products include cigarettes, chewing tobacco, and vaping devices, such as e-cigarettes. If you need help quitting, ask your health care provider. If you will be going home right after the procedure, plan to have a responsible adult: Take you home from the hospital or clinic. You will not be allowed to drive. Care for you for the time you are told. What happens during the procedure? An IV will be inserted into one of your veins. You may be given: A sedative. This helps you relax. Anesthesia. This will: Numb certain areas of your body. Make you fall asleep for surgery. A device called a cryoprobe will be used to freeze the growth. Liquid nitrogen or argon gas will flow through the device. How the device is put on the growth will depend on where the growth is   in your body. The cryoprobe may be: Applied directly to the area. This is often done for skin cancers and nodules. Inserted through an incision into the area, such as the prostate. Passed through a thin, long tube called an  endoscope. The scope will allow the device to reach deeper spots in your body, such as the lungs or liver. The cryoprobe may be guided using imaging. This may include an ultrasound, CT scan, or MRI. Liquid nitrogen or argon gas will be sent to the growth until it is frozen and destroyed. If other areas need treatment, the process may be repeated on those areas. The cryoprobe will be removed. Pressure will be applied to stop any bleeding. If an incision was made, it will be closed with stitches (sutures) and covered with a bandage (dressing). The procedure may vary among health care providers and hospitals. What happens after the procedure?  Your blood pressure, heart rate, breathing rate, and blood oxygen level will be monitored until you leave the hospital or clinic. You will be given medicine to help with pain, nausea, and vomiting as needed. If you were given a sedative during the procedure, it can affect you for several hours. Do not drive or operate machinery until your health care provider says that it is safe. This information is not intended to replace advice given to you by your health care provider. Make sure you discuss any questions you have with your health care provider. Document Revised: 03/13/2022 Document Reviewed: 03/13/2022 [employer] Patient Education  2023 [employer]
